# Patient Record
Sex: Male | Born: 1960 | Race: Black or African American | Hispanic: No | Marital: Married | Smoking: Never smoker
Health system: Southern US, Community
[De-identification: ages and names within clinical notes are randomized; demographics above are authoritative.]

## PROBLEM LIST (undated history)

## (undated) ENCOUNTER — Emergency Department (HOSPITAL_COMMUNITY): Admission: EM | Payer: Self-pay

## (undated) DIAGNOSIS — F32A Depression, unspecified: Secondary | ICD-10-CM

## (undated) DIAGNOSIS — F419 Anxiety disorder, unspecified: Secondary | ICD-10-CM

## (undated) DIAGNOSIS — J029 Acute pharyngitis, unspecified: Secondary | ICD-10-CM

## (undated) DIAGNOSIS — K5090 Crohn's disease, unspecified, without complications: Secondary | ICD-10-CM

## (undated) DIAGNOSIS — F329 Major depressive disorder, single episode, unspecified: Secondary | ICD-10-CM

## (undated) DIAGNOSIS — R21 Rash and other nonspecific skin eruption: Secondary | ICD-10-CM

## (undated) DIAGNOSIS — R519 Headache, unspecified: Secondary | ICD-10-CM

## (undated) DIAGNOSIS — R531 Weakness: Secondary | ICD-10-CM

## (undated) DIAGNOSIS — R51 Headache: Secondary | ICD-10-CM

## (undated) DIAGNOSIS — K603 Anal fistula: Secondary | ICD-10-CM

## (undated) DIAGNOSIS — R110 Nausea: Secondary | ICD-10-CM

## (undated) HISTORY — PX: OTHER SURGICAL HISTORY: SHX169

## (undated) HISTORY — DX: Rash and other nonspecific skin eruption: R21

## (undated) HISTORY — DX: Nausea: R11.0

## (undated) HISTORY — DX: Anal fistula: K60.3

## (undated) HISTORY — DX: Anxiety disorder, unspecified: F41.9

## (undated) HISTORY — DX: Major depressive disorder, single episode, unspecified: F32.9

## (undated) HISTORY — DX: Weakness: R53.1

## (undated) HISTORY — DX: Acute pharyngitis, unspecified: J02.9

## (undated) HISTORY — DX: Headache: R51

## (undated) HISTORY — DX: Headache, unspecified: R51.9

## (undated) HISTORY — DX: Depression, unspecified: F32.A

---

## 2011-10-14 ENCOUNTER — Emergency Department (HOSPITAL_COMMUNITY): Payer: Worker's Compensation

## 2011-10-14 ENCOUNTER — Inpatient Hospital Stay (HOSPITAL_COMMUNITY)
Admission: EM | Admit: 2011-10-14 | Discharge: 2011-10-20 | DRG: 964 | Disposition: A | Discharge status: Home / Self Care | Payer: Worker's Compensation | Attending: Surgery | Admitting: Surgery

## 2011-10-14 ENCOUNTER — Encounter (HOSPITAL_COMMUNITY): Payer: Self-pay | Admitting: Emergency Medicine

## 2011-10-14 DIAGNOSIS — S36113A Laceration of liver, unspecified degree, initial encounter: Secondary | ICD-10-CM

## 2011-10-14 DIAGNOSIS — Y9269 Other specified industrial and construction area as the place of occurrence of the external cause: Secondary | ICD-10-CM

## 2011-10-14 DIAGNOSIS — Z79899 Other long term (current) drug therapy: Secondary | ICD-10-CM

## 2011-10-14 DIAGNOSIS — K5090 Crohn's disease, unspecified, without complications: Secondary | ICD-10-CM

## 2011-10-14 DIAGNOSIS — I10 Essential (primary) hypertension: Secondary | ICD-10-CM | POA: Diagnosis present

## 2011-10-14 DIAGNOSIS — N280 Ischemia and infarction of kidney: Secondary | ICD-10-CM

## 2011-10-14 DIAGNOSIS — S36229A Contusion of unspecified part of pancreas, initial encounter: Secondary | ICD-10-CM

## 2011-10-14 DIAGNOSIS — S36209A Unspecified injury of unspecified part of pancreas, initial encounter: Secondary | ICD-10-CM

## 2011-10-14 DIAGNOSIS — S37009A Unspecified injury of unspecified kidney, initial encounter: Secondary | ICD-10-CM

## 2011-10-14 DIAGNOSIS — N2889 Other specified disorders of kidney and ureter: Secondary | ICD-10-CM | POA: Diagnosis present

## 2011-10-14 DIAGNOSIS — E876 Hypokalemia: Secondary | ICD-10-CM | POA: Diagnosis present

## 2011-10-14 DIAGNOSIS — IMO0001 Reserved for inherently not codable concepts without codable children: Principal | ICD-10-CM | POA: Diagnosis present

## 2011-10-14 DIAGNOSIS — S381XXA Crushing injury of abdomen, lower back, and pelvis, initial encounter: Secondary | ICD-10-CM

## 2011-10-14 DIAGNOSIS — W3189XA Contact with other specified machinery, initial encounter: Secondary | ICD-10-CM

## 2011-10-14 DIAGNOSIS — D62 Acute posthemorrhagic anemia: Secondary | ICD-10-CM | POA: Diagnosis not present

## 2011-10-14 DIAGNOSIS — Y990 Civilian activity done for income or pay: Secondary | ICD-10-CM

## 2011-10-14 HISTORY — DX: Crohn's disease, unspecified, without complications: K50.90

## 2011-10-14 LAB — CDS SEROLOGY

## 2011-10-14 LAB — COMPREHENSIVE METABOLIC PANEL
ALT: 401 U/L — ABNORMAL HIGH (ref 0–53)
AST: 495 U/L — ABNORMAL HIGH (ref 0–37)
Albumin: 3.8 g/dL (ref 3.5–5.2)
CO2: 27 mEq/L (ref 19–32)
Chloride: 102 mEq/L (ref 96–112)
Creatinine, Ser: 1.35 mg/dL (ref 0.50–1.35)
Potassium: 3.2 mEq/L — ABNORMAL LOW (ref 3.5–5.1)
Sodium: 142 mEq/L (ref 135–145)
Total Bilirubin: 1.8 mg/dL — ABNORMAL HIGH (ref 0.3–1.2)

## 2011-10-14 LAB — POCT I-STAT, CHEM 8
BUN: 10 mg/dL (ref 6–23)
Calcium, Ion: 1.15 mmol/L (ref 1.12–1.32)
Chloride: 103 mEq/L (ref 96–112)
Creatinine, Ser: 1.5 mg/dL — ABNORMAL HIGH (ref 0.50–1.35)
Glucose, Bld: 116 mg/dL — ABNORMAL HIGH (ref 70–99)

## 2011-10-14 LAB — CBC
Hemoglobin: 14.2 g/dL (ref 13.0–17.0)
MCH: 29.5 pg (ref 26.0–34.0)
MCHC: 35.5 g/dL (ref 30.0–36.0)
MCHC: 35.4 g/dL (ref 30.0–36.0)
MCV: 83.2 fL (ref 78.0–100.0)
Platelets: 156 10*3/uL (ref 150–400)
Platelets: 135 10*3/uL — ABNORMAL LOW (ref 150–400)
RBC: 4.81 MIL/uL (ref 4.22–5.81)
RDW: 13.5 % (ref 11.5–15.5)

## 2011-10-14 LAB — PROTIME-INR: Prothrombin Time: 13.3 seconds (ref 11.6–15.2)

## 2011-10-14 LAB — URINALYSIS, MICROSCOPIC ONLY
Bilirubin Urine: NEGATIVE
Glucose, UA: NEGATIVE mg/dL
Ketones, ur: NEGATIVE mg/dL
Protein, ur: 100 mg/dL — AB
pH: 7 (ref 5.0–8.0)

## 2011-10-14 LAB — LACTIC ACID, PLASMA: Lactic Acid, Venous: 2.2 mmol/L (ref 0.5–2.2)

## 2011-10-14 LAB — ABO/RH: ABO/RH(D): B POS

## 2011-10-14 LAB — MRSA PCR SCREENING: MRSA by PCR: NEGATIVE

## 2011-10-14 MED ORDER — LISINOPRIL 20 MG PO TABS
20.0000 mg | ORAL_TABLET | Freq: Every day | ORAL | Status: DC
  Administered 2011-10-14 – 2011-10-20 (×7): 20 mg via ORAL
  Filled 2011-10-14 (×7): qty 1

## 2011-10-14 MED ORDER — HYDROMORPHONE HCL PF 1 MG/ML IJ SOLN
1.0000 mg | Freq: Once | INTRAMUSCULAR | Status: AC
  Administered 2011-10-14: 1 mg via INTRAVENOUS
  Filled 2011-10-14: qty 1

## 2011-10-14 MED ORDER — PANTOPRAZOLE SODIUM 40 MG IV SOLR
40.0000 mg | Freq: Every day | INTRAVENOUS | Status: DC
  Filled 2011-10-14 (×3): qty 40

## 2011-10-14 MED ORDER — DIPHENHYDRAMINE HCL 12.5 MG/5ML PO ELIX
12.5000 mg | ORAL_SOLUTION | Freq: Four times a day (QID) | ORAL | Status: DC | PRN
  Filled 2011-10-14: qty 5

## 2011-10-14 MED ORDER — ONDANSETRON HCL 4 MG/2ML IJ SOLN
4.0000 mg | Freq: Four times a day (QID) | INTRAMUSCULAR | Status: DC | PRN
  Administered 2011-10-14: 4 mg via INTRAVENOUS
  Filled 2011-10-14: qty 2

## 2011-10-14 MED ORDER — SODIUM CHLORIDE 0.9 % IJ SOLN
9.0000 mL | INTRAMUSCULAR | Status: DC | PRN
  Administered 2011-10-17: 9 mL via INTRAVENOUS

## 2011-10-14 MED ORDER — IOHEXOL 300 MG/ML  SOLN
100.0000 mL | Freq: Once | INTRAMUSCULAR | Status: AC | PRN
  Administered 2011-10-14: 100 mL via INTRAVENOUS

## 2011-10-14 MED ORDER — HYDROMORPHONE HCL PF 1 MG/ML IJ SOLN
1.0000 mg | Freq: Once | INTRAMUSCULAR | Status: AC
  Administered 2011-10-14: 1 mg via INTRAVENOUS

## 2011-10-14 MED ORDER — NALOXONE HCL 0.4 MG/ML IJ SOLN: 0.4000 mg | INTRAMUSCULAR | Status: DC | PRN

## 2011-10-14 MED ORDER — PANTOPRAZOLE SODIUM 40 MG PO TBEC
40.0000 mg | DELAYED_RELEASE_TABLET | Freq: Every day | ORAL | Status: DC
  Administered 2011-10-14 – 2011-10-20 (×7): 40 mg via ORAL
  Filled 2011-10-14 (×7): qty 1

## 2011-10-14 MED ORDER — DEXTROSE IN LACTATED RINGERS 5 % IV SOLN
INTRAVENOUS | Status: DC
  Administered 2011-10-14 – 2011-10-15 (×2): 125 mL/h via INTRAVENOUS

## 2011-10-14 MED ORDER — HYDROMORPHONE 0.3 MG/ML IV SOLN
INTRAVENOUS | Status: DC
  Administered 2011-10-14: 20:00:00 via INTRAVENOUS
  Administered 2011-10-15 (×3): 0.3 mg via INTRAVENOUS
  Administered 2011-10-15: 0.6 mg via INTRAVENOUS
  Administered 2011-10-15 (×2): 0.3 mg via INTRAVENOUS
  Filled 2011-10-14: qty 25

## 2011-10-14 MED ORDER — HYDROMORPHONE 0.3 MG/ML IV SOLN
INTRAVENOUS | Status: AC
  Filled 2011-10-14: qty 25

## 2011-10-14 MED ORDER — DIPHENHYDRAMINE HCL 50 MG/ML IJ SOLN
12.5000 mg | Freq: Four times a day (QID) | INTRAMUSCULAR | Status: DC | PRN
  Administered 2011-10-15 (×2): 12.5 mg via INTRAVENOUS
  Filled 2011-10-14 (×2): qty 1

## 2011-10-14 MED ORDER — QUINAPRIL HCL 10 MG PO TABS: 20.0000 mg | ORAL_TABLET | Freq: Every day | ORAL | Status: DC

## 2011-10-14 NOTE — ED Notes (Signed)
2313-01 Ready

## 2011-10-14 NOTE — ED Notes (Signed)
Patient transported to CT with MD, RN and RN on monitor

## 2011-10-14 NOTE — ED Provider Notes (Signed)
Patient seen on arrival with trauma team as level 1 trauma. Patient was at work and leaned over a large roller  onto his abdomen and a large roller that was above him came down and had him trapped between the 2 rollers for a brief period of time. A coworker hit the kill switch. He almost passed out and had rectal incontenance and vomiting.  PT c/o abdominal pain.  PT presents via EMS without backboard or C spine precautions.  PCP [facility]  PMH Crohn's Disease HTN  Surgery--intestinal surgery.  NKA  SH Denies smoking or alcohol employed  Meds none  PE done by Trauma and Dr Regenia Skeeter. Pt is alert, appears painful. He has tenderness in his epigastric abdomen. He is intact neurologically in his lower extremities.   FAST neg by Dr Grandville Silos.  Pt had patent airway and turned over to trauma team.   I saw and evaluated the patient, reviewed the resident's note and I agree with the findings and plan. [name], MD, FACEP   Janice Norrie, MD 10/14/11 [phone]

## 2011-10-14 NOTE — ED Notes (Signed)
Per ems- pt was working and was leaning over a roller and fell back into it and was pinned between two rollers and another coworker shut the machine off.

## 2011-10-14 NOTE — ED Provider Notes (Signed)
History     CSN: [id number]  Arrival date & time 10/14/11  1704   First MD Initiated Contact with Patient 10/14/11 1717      Chief Complaint  Patient presents with  . Trauma    (Consider location/radiation/quality/duration/timing/severity/associated sxs/prior treatment) Patient is a 51 y.o. male presenting with trauma. The history is provided by the patient and the EMS personnel.  Trauma This is a new problem. The current episode started today. The problem occurs constantly. The problem has been unchanged. Associated symptoms include abdominal pain and vomiting (once). Pertinent negatives include no chest pain, chills, congestion, coughing, fever, headaches, nausea or numbness. The symptoms are aggravated by nothing. He has tried nothing for the symptoms.    Past Medical History  Diagnosis Date  . Crohn disease     Past Surgical History  Procedure Date  . Bowl resection     No family history on file.  History  Substance Use Topics  . Smoking status: Not on file  . Smokeless tobacco: Not on file  . Alcohol Use:       Review of Systems  Constitutional: Negative for fever and chills.  HENT: Negative for congestion and rhinorrhea.   Respiratory: Negative for cough and shortness of breath.   Cardiovascular: Negative for chest pain and leg swelling.  Gastrointestinal: Positive for vomiting (once) and abdominal pain. Negative for nausea, constipation and blood in stool.  Genitourinary: Negative for dysuria and decreased urine volume.  Musculoskeletal: Positive for back pain.  Neurological: Negative for numbness and headaches.  Psychiatric/Behavioral: Negative for confusion.  All other systems reviewed and are negative.    Allergies  Review of patient's allergies indicates no known allergies.  Home Medications  No current outpatient prescriptions on file.  BP 150/90  Pulse 73  Temp(Src) 97.7 F (36.5 C) (Oral)  Resp 26  SpO2 100%  Physical Exam  Nursing  note and vitals reviewed. Constitutional: He is oriented to person, place, and time. He appears well-developed and well-nourished.  HENT:  Head: Normocephalic and atraumatic.  Right Ear: External ear normal.  Left Ear: External ear normal.  Nose: Nose normal.  Eyes: EOM are normal. Pupils are equal, round, and reactive to light.  Neck: Neck supple.  Cardiovascular: Normal rate, regular rhythm, normal heart sounds and intact distal pulses.   Pulmonary/Chest: Effort normal and breath sounds normal. No respiratory distress. He has no wheezes. He has no rales. He exhibits no tenderness.  Abdominal: Soft. He exhibits no distension and no mass. There is tenderness in the epigastric area and left upper quadrant. There is no rigidity, no rebound and no guarding.  Genitourinary: Rectum normal.  Musculoskeletal: He exhibits no edema.       Lumbar back: He exhibits tenderness.  Lymphadenopathy:    He has no cervical adenopathy.  Neurological: He is alert and oriented to person, place, and time.  Skin: Skin is warm and dry.    ED Course  Procedures (including critical care time)  Labs Reviewed  COMPREHENSIVE METABOLIC PANEL - Abnormal; Notable for the following:    Potassium 3.2 (*)    Glucose, Bld 112 (*)    AST 495 (*)    ALT 401 (*)    Total Bilirubin 1.8 (*)    GFR calc non Af Amer 60 (*)    GFR calc Af Amer 70 (*)    All other components within normal limits  URINALYSIS, WITH MICROSCOPIC - Abnormal; Notable for the following:    Hgb urine dipstick  SMALL (*)    Protein, ur 100 (*)    Casts HYALINE CASTS (*)    All other components within normal limits  LIPASE, BLOOD - Abnormal; Notable for the following:    Lipase 60 (*)    All other components within normal limits  POCT I-STAT, CHEM 8 - Abnormal; Notable for the following:    Potassium 3.2 (*)    Creatinine, Ser 1.50 (*)    Glucose, Bld 116 (*)    All other components within normal limits  TYPE AND SCREEN  CDS SEROLOGY  CBC    LACTIC ACID, PLASMA  PROTIME-INR  ABO/RH  SAMPLE TO BLOOD BANK  LACTIC ACID, PLASMA   Ct Chest W Contrast  10/14/2011  *RADIOLOGY REPORT*  Clinical Data:  51 year old male with a crush injury and chest, abdomen, and pelvic pain.  CT CHEST, ABDOMEN AND PELVIS WITH CONTRAST  Technique:  Multidetector CT imaging of the chest, abdomen and pelvis was performed following the standard protocol during bolus administration of intravenous contrast.  Contrast: 128mL OMNIPAQUE IOHEXOL 300 MG/ML IJ SOLN  Comparison:  None  CT CHEST  Findings:  The heart and great vessels are within normal limits except for moderate coronary artery calcifications. There is no evidence of mediastinal hematoma, pneumothorax or pleural / pericardial effusions. No enlarged abnormal appearing lymph nodes are identified.  The lungs are clear except for minimal right basilar atelectasis. There is no evidence of airspace disease, consolidation, nodule/mass or endobronchial/endotracheal lesions. No acute or suspicious bony abnormalities are identified.  IMPRESSION: No evidence of acute injury to the chest.  Minimal right basilar atelectasis.  Coronary artery disease.  CT ABDOMEN AND PELVIS  Findings:  A 2 cm laceration of the inferior medial left hepatic segment, adjacent to the falciform ligament, is noted with adjacent hematoma and small amount of complex fluid/blood within the abdomen and paracolic gutters.  There is blood between the pancreatic body and splenic vein compatible with pancreatic injury.  No large fracture lines are identified but portions of the body are indistinct. There are several small infarcts within the left kidney.  The left renal artery is grossly unremarkable. Hemorrhage within the left retroperitoneum is identified. Hemorrhage/hematoma in the region of the gastrohepatic ligament and inferior to the left liver is noted.  There is no evidence of acute splenic abnormality. The adrenal glands, right kidney and gallbladder  are unremarkable.  There is no evidence of pneumoperitoneum. No bowel abnormalities identified except for changes of previous right colonic/small bowel surgery. There is no evidence of biliary dilatation or abdominal aortic aneurysm.  No acute or suspicious bony abnormalities are present.  IMPRESSION: 2 cm inferior left hepatic laceration with adjacent hemorrhage/hematoma and small amount of blood within the abdomen.  Pancreatic body injury without discrete fracture. Some portions of the pancreatic body however are indistinct.  Multiple small left renal infarcts.  The left renal artery is grossly unremarkable but consider further evaluation as clinically indicated.  Original Report Authenticated By: Lura Em, M.D.   Ct Abdomen Pelvis W Contrast  10/14/2011  *RADIOLOGY REPORT*  Clinical Data:  51 year old male with a crush injury and chest, abdomen, and pelvic pain.  CT CHEST, ABDOMEN AND PELVIS WITH CONTRAST  Technique:  Multidetector CT imaging of the chest, abdomen and pelvis was performed following the standard protocol during bolus administration of intravenous contrast.  Contrast: 17mL OMNIPAQUE IOHEXOL 300 MG/ML IJ SOLN  Comparison:  None  CT CHEST  Findings:  The heart and great vessels are  within normal limits except for moderate coronary artery calcifications. There is no evidence of mediastinal hematoma, pneumothorax or pleural / pericardial effusions. No enlarged abnormal appearing lymph nodes are identified.  The lungs are clear except for minimal right basilar atelectasis. There is no evidence of airspace disease, consolidation, nodule/mass or endobronchial/endotracheal lesions. No acute or suspicious bony abnormalities are identified.  IMPRESSION: No evidence of acute injury to the chest.  Minimal right basilar atelectasis.  Coronary artery disease.  CT ABDOMEN AND PELVIS  Findings:  A 2 cm laceration of the inferior medial left hepatic segment, adjacent to the falciform ligament, is noted  with adjacent hematoma and small amount of complex fluid/blood within the abdomen and paracolic gutters.  There is blood between the pancreatic body and splenic vein compatible with pancreatic injury.  No large fracture lines are identified but portions of the body are indistinct. There are several small infarcts within the left kidney.  The left renal artery is grossly unremarkable. Hemorrhage within the left retroperitoneum is identified. Hemorrhage/hematoma in the region of the gastrohepatic ligament and inferior to the left liver is noted.  There is no evidence of acute splenic abnormality. The adrenal glands, right kidney and gallbladder are unremarkable.  There is no evidence of pneumoperitoneum. No bowel abnormalities identified except for changes of previous right colonic/small bowel surgery. There is no evidence of biliary dilatation or abdominal aortic aneurysm.  No acute or suspicious bony abnormalities are present.  IMPRESSION: 2 cm inferior left hepatic laceration with adjacent hemorrhage/hematoma and small amount of blood within the abdomen.  Pancreatic body injury without discrete fracture. Some portions of the pancreatic body however are indistinct.  Multiple small left renal infarcts.  The left renal artery is grossly unremarkable but consider further evaluation as clinically indicated.  Original Report Authenticated By: Lura Em, M.D.   Dg Pelvis Portable  10/14/2011  *RADIOLOGY REPORT*  Clinical Data: [place] between two steel rollers, crushing injury.  PORTABLE PELVIS  Comparison:  None.  Findings:  There is no evidence of pelvic fracture or diastasis. No other pelvic bone lesions are seen. Triangular radiopaque density at the intersection of the bladder and the right superior pubic bone felt to represent a phlebolith.  IMPRESSION: Negative.  Original Report Authenticated By: Staci Righter, M.D.   Dg Chest Port 1 View  10/14/2011  *RADIOLOGY REPORT*  Clinical Data: Crushing injury.   PORTABLE CHEST - 1 VIEW  Comparison: None.  Findings: Normal heart size with clear lung fields.  No bony abnormality.  No visible free air.  IMPRESSION: Negative.  Recommend CT chest, abdomen, and pelvis for further evaluation.  Original Report Authenticated By: Staci Righter, M.D.     1. Crushing injury of abdomen   2. Liver laceration       MDM  51 yo male who was crushed between 2 rollers at work for a few seconds until someone hit the "kill switch". One roller landed on top of him on his back as he was on the other. Level 1 trauma alert for mechanism and physician discretion. Abd exam stable, and FAST exam negative. EJ placed by myself for IV access. Scans show small liver lac as well as elevated transaminases. Pain pretty well controlled in ED. VS remained stable in ED. Admitted to trauma.      Sherwood Gambler, MD 10/14/11 [phone]

## 2011-10-14 NOTE — H&P (Signed)
Darren Burnett is an 51 y.o. male.   Chief Complaint: Lower chest and upper abdominal pain after industrial accident HPI: Patient works at a [employer]. He was leaning over a paper roller when another roll was closed on top of him. This pinned him on his lower chest and upper abdomen for a few seconds until the machine could be opened up. He had a bowel movement during that time he was pinned and vomited directly after. He was brought in as a level one trauma. He complains of lower chest pain and upper abdominal pain. He had no loss of consciousness. He had no trauma to other portions of his body.  Past Medical History  Diagnosis Date  . Crohn disease     Past Surgical History  Procedure Date  . Bowl resection     No family history on file. Social History:  does not have a smoking history on file. He does not have any smokeless tobacco history on file. His alcohol and drug histories not on file.  Allergies: No Known Allergies  Medications Prior to Admission  Medication Dose Route Frequency Provider Last Rate Last Dose  . HYDROmorphone (DILAUDID) injection 1 mg  1 mg Intravenous Once Sherwood Gambler, MD      . HYDROmorphone (DILAUDID) injection 1 mg  1 mg Intravenous Once Sherwood Gambler, MD   1 mg at 10/14/11 1720  . iohexol (OMNIPAQUE) 300 MG/ML solution 100 mL  100 mL Intravenous Once PRN Medication Radiologist, MD   100 mL at 10/14/11 1743   Medications Prior to Admission  Medication Sig Dispense Refill  . amLODipine (NORVASC) 2.5 MG tablet Take 2.5 mg by mouth daily.      . Lansoprazole (PREVACID 24HR PO) Take 1 capsule by mouth daily as needed. For acid reflux      . quinapril (ACCUPRIL) 20 MG tablet Take 20 mg by mouth daily.        Results for orders placed during the hospital encounter of 10/14/11 (from the past 48 hour(s))  TYPE AND SCREEN     Status: Normal   Collection Time   10/14/11  4:55 PM      Component Value Range Comment   ABO/RH(D) B POS      Antibody  Screen PENDING      Sample Expiration 10/17/2011      Unit Number [id number]      Blood Component Type RBC LR PHER1      Unit division 00      Status of Unit REL FROM [facility]      Unit tag comment VERBAL ORDERS PER DR KNAPP      Transfusion Status OK TO TRANSFUSE      Crossmatch Result PENDING      Unit Number [id number]      Blood Component Type RED CELLS,LR      Unit division 00      Status of Unit REL FROM [facility]      Unit tag comment VERBAL ORDERS PER DR KNAPP      Transfusion Status OK TO TRANSFUSE      Crossmatch Result PENDING     LACTIC ACID, PLASMA     Status: Normal   Collection Time   10/14/11  5:17 PM      Component Value Range Comment   Lactic Acid, Venous 2.2  0.5 - 2.2 (mmol/L)   CDS SEROLOGY     Status: Normal   Collection Time   10/14/11  5:21 PM  Component Value Range Comment   CDS serology specimen        Value: SPECIMEN WILL BE HELD FOR 14 DAYS IF TESTING IS REQUIRED  CBC     Status: Normal   Collection Time   10/14/11  5:21 PM      Component Value Range Comment   WBC 8.3  4.0 - 10.5 (K/uL)    RBC 4.81  4.22 - 5.81 (MIL/uL)    Hemoglobin 14.2  13.0 - 17.0 (g/dL)    HCT 40.0  39.0 - 52.0 (%)    MCV 83.2  78.0 - 100.0 (fL)    MCH 29.5  26.0 - 34.0 (pg)    MCHC 35.5  30.0 - 36.0 (g/dL)    RDW 13.5  11.5 - 15.5 (%)    Platelets 156  150 - 400 (K/uL)   PROTIME-INR     Status: Normal   Collection Time   10/14/11  5:21 PM      Component Value Range Comment   Prothrombin Time 13.3  11.6 - 15.2 (seconds)    INR 0.99  0.00 - 1.49    POCT I-STAT, CHEM 8     Status: Abnormal   Collection Time   10/14/11  5:29 PM      Component Value Range Comment   Sodium 144  135 - 145 (mEq/L)    Potassium 3.2 (*) 3.5 - 5.1 (mEq/L)    Chloride 103  96 - 112 (mEq/L)    BUN 10  6 - 23 (mg/dL)    Creatinine, Ser 1.50 (*) 0.50 - 1.35 (mg/dL)    Glucose, Bld 116 (*) 70 - 99 (mg/dL)    Calcium, Ion 1.15  1.12 - 1.32 (mmol/L)    TCO2 29  0 - 100 (mmol/L)    Hemoglobin 14.3  13.0  - 17.0 (g/dL)    HCT 42.0  39.0 - 52.0 (%)    Dg Pelvis Portable  10/14/2011  *RADIOLOGY REPORT*  Clinical Data: [place] between two steel rollers, crushing injury.  PORTABLE PELVIS  Comparison:  None.  Findings:  There is no evidence of pelvic fracture or diastasis. No other pelvic bone lesions are seen. Triangular radiopaque density at the intersection of the bladder and the right superior pubic bone felt to represent a phlebolith.  IMPRESSION: Negative.  Original Report Authenticated By: Staci Righter, M.D.   Dg Chest Port 1 View  10/14/2011  *RADIOLOGY REPORT*  Clinical Data: Crushing injury.  PORTABLE CHEST - 1 VIEW  Comparison: None.  Findings: Normal heart size with clear lung fields.  No bony abnormality.  No visible free air.  IMPRESSION: Negative.  Recommend CT chest, abdomen, and pelvis for further evaluation.  Original Report Authenticated By: Staci Righter, M.D.    Review of Systems  Constitutional: Negative.   HENT: Negative.   Eyes: Negative.   Respiratory: Negative.   Cardiovascular: Positive for chest pain.       Lower rib pain  Gastrointestinal: Positive for vomiting and abdominal pain. Negative for blood in stool.       Emesis and bowel movement at the scene of the accident, history of Crohn's disease  Genitourinary: Negative.   Musculoskeletal: Positive for back pain.  Skin: Negative.   Neurological: Negative.   Endo/Heme/Allergies: Negative.     Blood pressure 150/90, pulse 73, temperature 97.7 F (36.5 C), temperature source Oral, resp. rate 26, SpO2 100.00%. Physical Exam  Constitutional: He is oriented to person, place, and time. He appears well-developed and  well-nourished. He appears distressed.  HENT:  Head: Normocephalic and atraumatic.  Mouth/Throat: Oropharynx is clear and moist.  Eyes: Conjunctivae and EOM are normal. Pupils are equal, round, and reactive to light.  Neck: Normal range of motion. Neck supple.       No tenderness  Cardiovascular: Normal  rate, regular rhythm, normal heart sounds and intact distal pulses.   Respiratory: Effort normal and breath sounds normal. No respiratory distress. He has no wheezes. He has no rales. He exhibits tenderness.       Lower rib tenderness anteriorly  GI: Soft. He exhibits no distension. There is tenderness. There is no rebound and no guarding.       Upper abdominal and epigastric tenderness with no guarding, rectal exam Brown stool with no gross blood  Genitourinary: Penis normal.  Musculoskeletal: Normal range of motion.  Neurological: He is alert and oriented to person, place, and time. Coordination normal.       Good strength x4 extremities  Skin: Skin is warm and dry.     Assessment/Plan Crushing injury to lower chest and upper abdomen Grade 2 liver laceration - bedrest and serial Hb Pancreatic contusion - check amylase and lipase, NPO Small L renal infarcts - F/U creatinine HTN - start one home med for now Admit to ICU  Oday Ridings E 10/14/2011, 6:04 PM

## 2011-10-14 NOTE — ED Notes (Signed)
MD states to admit to ICU. Lacerated liver and pancrease damage

## 2011-10-14 NOTE — Progress Notes (Addendum)
Chaplain's Note:  Responded to pg for LV1 crush @ 17:00.  Offered pastoral presence to staff and patient while pt being treated.  Following initial triage and treatment, introduced myself to pt and offered pastoral presence and support.  Patient stated wife was coming from [place].  Pt left for cat scan.  Will follow up as able or requested.  18:00 follow-up with pt after scans in rm 1.  Offered support to pt.  Patient had a person from his employer enter to check on him.  Excused myself so they could talk.  Patient thanked chaplain for visit. Will follow-up as needed or requested

## 2011-10-15 LAB — COMPREHENSIVE METABOLIC PANEL
ALT: 249 U/L — ABNORMAL HIGH (ref 0–53)
AST: 266 U/L — ABNORMAL HIGH (ref 0–37)
CO2: 29 mEq/L (ref 19–32)
Calcium: 8.6 mg/dL (ref 8.4–10.5)
GFR calc non Af Amer: 66 mL/min — ABNORMAL LOW (ref >90)
Potassium: 3.2 mEq/L — ABNORMAL LOW (ref 3.5–5.1)
Sodium: 144 mEq/L (ref 135–145)

## 2011-10-15 LAB — CBC
HCT: 35.3 % — ABNORMAL LOW (ref 39.0–52.0)
Hemoglobin: 12.3 g/dL — ABNORMAL LOW (ref 13.0–17.0)
Hemoglobin: 12.9 g/dL — ABNORMAL LOW (ref 13.0–17.0)
MCH: 29.6 pg (ref 26.0–34.0)
MCHC: 34.8 g/dL (ref 30.0–36.0)
MCHC: 34.5 g/dL (ref 30.0–36.0)
Platelets: 147 10*3/uL — ABNORMAL LOW (ref 150–400)
RBC: 4.47 MIL/uL (ref 4.22–5.81)
RBC: 4.38 MIL/uL (ref 4.22–5.81)
WBC: 8.1 10*3/uL (ref 4.0–10.5)
WBC: 6.3 10*3/uL (ref 4.0–10.5)

## 2011-10-15 LAB — TYPE AND SCREEN: Unit division: 0

## 2011-10-15 LAB — AMYLASE: Amylase: 54 U/L (ref 0–105)

## 2011-10-15 MED ORDER — POTASSIUM CHLORIDE CRYS ER 20 MEQ PO TBCR
20.0000 meq | EXTENDED_RELEASE_TABLET | Freq: Three times a day (TID) | ORAL | Status: AC
  Administered 2011-10-15 (×3): 20 meq via ORAL
  Filled 2011-10-15 (×3): qty 1

## 2011-10-15 MED ORDER — CHLORHEXIDINE GLUCONATE 0.12 % MT SOLN
OROMUCOSAL | Status: AC
  Filled 2011-10-15: qty 15

## 2011-10-15 MED ORDER — KCL-LACTATED RINGERS-D5W 20 MEQ/L IV SOLN
INTRAVENOUS | Status: DC
  Administered 2011-10-15 – 2011-10-16 (×3): via INTRAVENOUS
  Administered 2011-10-17: 75 mL/h via INTRAVENOUS
  Filled 2011-10-15 (×7): qty 1000

## 2011-10-15 NOTE — Progress Notes (Signed)
Will decrease frequency of Hgb checks.  This patient has been seen and I agree with the findings and treatment plan.  Kathryne Eriksson. Dahlia Bailiff, MD, [facility] [phone] (pager) [phone] (direct pager) Trauma Surgeon

## 2011-10-15 NOTE — Progress Notes (Signed)
Subjective: Minimal c/o abd and back pain. Passing flatus.   Objective: Vital signs in last 24 hours: Temp:  [97.4 F (36.3 C)-98.1 F (36.7 C)] 97.5 F (36.4 C) (03/29 0753) Pulse Rate:  [45-74] 45  (03/29 0700) Resp:  [9-26] 10  (03/29 0700) BP: (100-165)/(63-98) 126/85 mmHg (03/29 0700) SpO2:  [97 %-100 %] 98 % (03/29 0700)    Intake/Output from previous day: 03/28 0701 - 03/29 0700 In: 1504 [I.V.:1504] Out: 1400 [Urine:1400] Intake/Output this shift:    General appearance: alert, cooperative, appears stated age and no distress Resp: clear to auscultation bilaterally Cardio: regular rate and rhythm and bradycardic at times GI: soft, mildly-tender; bowel sounds normal; no masses,  no organomegaly Neurologic: Grossly normal  Lab Results:   Basename 10/15/11 0650 10/15/11 0042  WBC 6.0 8.1  HGB 12.3* 13.1  HCT 35.3* 37.9*  PLT 130* 147*   BMET  Basename 10/15/11 0650 10/14/11 1729 10/14/11 1721  NA 144 144 --  K 3.2* 3.2* --  CL 108 103 --  CO2 29 -- 27  GLUCOSE 123* 116* --  BUN 9 10 --  CREATININE 1.25 1.50* --  CALCIUM 8.6 -- 9.5   PT/INR  Basename 10/14/11 1721  LABPROT 13.3  INR 0.99   ABG No results found for this basename: PHART:2,PCO2:2,PO2:2,HCO3:2 in the last 72 hours  Studies/Results: Ct Chest W Contrast  10/14/2011  *RADIOLOGY REPORT*  Clinical Data:  51 year old male with a crush injury and chest, abdomen, and pelvic pain.  CT CHEST, ABDOMEN AND PELVIS WITH CONTRAST  Technique:  Multidetector CT imaging of the chest, abdomen and pelvis was performed following the standard protocol during bolus administration of intravenous contrast.  Contrast: 158mL OMNIPAQUE IOHEXOL 300 MG/ML IJ SOLN  Comparison:  None  CT CHEST  Findings:  The heart and great vessels are within normal limits except for moderate coronary artery calcifications. There is no evidence of mediastinal hematoma, pneumothorax or pleural / pericardial effusions. No enlarged abnormal  appearing lymph nodes are identified.  The lungs are clear except for minimal right basilar atelectasis. There is no evidence of airspace disease, consolidation, nodule/mass or endobronchial/endotracheal lesions. No acute or suspicious bony abnormalities are identified.  IMPRESSION: No evidence of acute injury to the chest.  Minimal right basilar atelectasis.  Coronary artery disease.  CT ABDOMEN AND PELVIS  Findings:  A 2 cm laceration of the inferior medial left hepatic segment, adjacent to the falciform ligament, is noted with adjacent hematoma and small amount of complex fluid/blood within the abdomen and paracolic gutters.  There is blood between the pancreatic body and splenic vein compatible with pancreatic injury.  No large fracture lines are identified but portions of the body are indistinct. There are several small infarcts within the left kidney.  The left renal artery is grossly unremarkable. Hemorrhage within the left retroperitoneum is identified. Hemorrhage/hematoma in the region of the gastrohepatic ligament and inferior to the left liver is noted.  There is no evidence of acute splenic abnormality. The adrenal glands, right kidney and gallbladder are unremarkable.  There is no evidence of pneumoperitoneum. No bowel abnormalities identified except for changes of previous right colonic/small bowel surgery. There is no evidence of biliary dilatation or abdominal aortic aneurysm.  No acute or suspicious bony abnormalities are present.  IMPRESSION: 2 cm inferior left hepatic laceration with adjacent hemorrhage/hematoma and small amount of blood within the abdomen.  Pancreatic body injury without discrete fracture. Some portions of the pancreatic body however are indistinct.  Multiple  small left renal infarcts.  The left renal artery is grossly unremarkable but consider further evaluation as clinically indicated.  Original Report Authenticated By: Lura Em, M.D.   Ct Abdomen Pelvis W  Contrast  10/14/2011  *RADIOLOGY REPORT*  Clinical Data:  52 year old male with a crush injury and chest, abdomen, and pelvic pain.  CT CHEST, ABDOMEN AND PELVIS WITH CONTRAST  Technique:  Multidetector CT imaging of the chest, abdomen and pelvis was performed following the standard protocol during bolus administration of intravenous contrast.  Contrast: 138mL OMNIPAQUE IOHEXOL 300 MG/ML IJ SOLN  Comparison:  None  CT CHEST  Findings:  The heart and great vessels are within normal limits except for moderate coronary artery calcifications. There is no evidence of mediastinal hematoma, pneumothorax or pleural / pericardial effusions. No enlarged abnormal appearing lymph nodes are identified.  The lungs are clear except for minimal right basilar atelectasis. There is no evidence of airspace disease, consolidation, nodule/mass or endobronchial/endotracheal lesions. No acute or suspicious bony abnormalities are identified.  IMPRESSION: No evidence of acute injury to the chest.  Minimal right basilar atelectasis.  Coronary artery disease.  CT ABDOMEN AND PELVIS  Findings:  A 2 cm laceration of the inferior medial left hepatic segment, adjacent to the falciform ligament, is noted with adjacent hematoma and small amount of complex fluid/blood within the abdomen and paracolic gutters.  There is blood between the pancreatic body and splenic vein compatible with pancreatic injury.  No large fracture lines are identified but portions of the body are indistinct. There are several small infarcts within the left kidney.  The left renal artery is grossly unremarkable. Hemorrhage within the left retroperitoneum is identified. Hemorrhage/hematoma in the region of the gastrohepatic ligament and inferior to the left liver is noted.  There is no evidence of acute splenic abnormality. The adrenal glands, right kidney and gallbladder are unremarkable.  There is no evidence of pneumoperitoneum. No bowel abnormalities identified except for  changes of previous right colonic/small bowel surgery. There is no evidence of biliary dilatation or abdominal aortic aneurysm.  No acute or suspicious bony abnormalities are present.  IMPRESSION: 2 cm inferior left hepatic laceration with adjacent hemorrhage/hematoma and small amount of blood within the abdomen.  Pancreatic body injury without discrete fracture. Some portions of the pancreatic body however are indistinct.  Multiple small left renal infarcts.  The left renal artery is grossly unremarkable but consider further evaluation as clinically indicated.  Original Report Authenticated By: Lura Em, M.D.   Dg Pelvis Portable  10/14/2011  *RADIOLOGY REPORT*  Clinical Data: [place] between two steel rollers, crushing injury.  PORTABLE PELVIS  Comparison:  None.  Findings:  There is no evidence of pelvic fracture or diastasis. No other pelvic bone lesions are seen. Triangular radiopaque density at the intersection of the bladder and the right superior pubic bone felt to represent a phlebolith.  IMPRESSION: Negative.  Original Report Authenticated By: Staci Righter, M.D.   Dg Chest Port 1 View  10/14/2011  *RADIOLOGY REPORT*  Clinical Data: Crushing injury.  PORTABLE CHEST - 1 VIEW  Comparison: None.  Findings: Normal heart size with clear lung fields.  No bony abnormality.  No visible free air.  IMPRESSION: Negative.  Recommend CT chest, abdomen, and pelvis for further evaluation.  Original Report Authenticated By: Staci Righter, M.D.    Anti-infectives: Anti-infectives    None      Assessment/Plan: s/p * No surgery found * Patient Active Problem List  Diagnoses  .  Pancreatic contusion  . Liver laceration  . Renal infarct  . Crohn disease    Crush to abd Pancreatic contusion- Lipase normalized, will start some clears and see how he does with po Liver laceration- HD stable, slight decline in Hgb this am, will repeat later today and am Renal infarcts- Renal function stable, normal,  will continue to monitor Mild ABL anemia-follow up later today Crohn's Dz- not currently on meds Hx HTN- follow FEN- Hypokalemia- Change MIVF and decrease rate, start clears po and monitor tolerance VTE- SCD's DISPO- as above, allow some clear liquids, re check H&H later today. Continue bedrest today.    LOS: 1 day    [facility] Pager [phone] General Trauma Pager [phone]

## 2011-10-15 NOTE — ED Provider Notes (Signed)
See prior note   Janice Norrie, MD 10/15/11 0020

## 2011-10-15 NOTE — Progress Notes (Signed)
Utilization review completed. Jacquelina Hewins Diane3/29/2013

## 2011-10-16 ENCOUNTER — Encounter (HOSPITAL_COMMUNITY): Payer: Self-pay | Admitting: *Deleted

## 2011-10-16 LAB — CBC
MCV: 85.9 fL (ref 78.0–100.0)
Platelets: 126 10*3/uL — ABNORMAL LOW (ref 150–400)
RBC: 4.27 MIL/uL (ref 4.22–5.81)
WBC: 6.2 10*3/uL (ref 4.0–10.5)

## 2011-10-16 LAB — COMPREHENSIVE METABOLIC PANEL
ALT: 251 U/L — ABNORMAL HIGH (ref 0–53)
AST: 197 U/L — ABNORMAL HIGH (ref 0–37)
CO2: 31 mEq/L (ref 19–32)
Chloride: 105 mEq/L (ref 96–112)
Creatinine, Ser: 1.37 mg/dL — ABNORMAL HIGH (ref 0.50–1.35)
GFR calc non Af Amer: 59 mL/min — ABNORMAL LOW (ref >90)
Total Bilirubin: 2.3 mg/dL — ABNORMAL HIGH (ref 0.3–1.2)

## 2011-10-16 LAB — AMYLASE: Amylase: 55 U/L (ref 0–105)

## 2011-10-16 MED ORDER — HYDROMORPHONE HCL PF 1 MG/ML IJ SOLN
1.0000 mg | INTRAMUSCULAR | Status: DC | PRN
  Administered 2011-10-17: 0.5 mg via INTRAVENOUS
  Filled 2011-10-16: qty 1

## 2011-10-16 MED ORDER — HYDROCODONE-ACETAMINOPHEN 5-325 MG PO TABS
1.0000 | ORAL_TABLET | ORAL | Status: DC | PRN
  Administered 2011-10-16 – 2011-10-17 (×5): 1 via ORAL
  Administered 2011-10-18: 2 via ORAL
  Administered 2011-10-18: 1 via ORAL
  Administered 2011-10-19: 2 via ORAL
  Filled 2011-10-16: qty 1
  Filled 2011-10-16 (×2): qty 2
  Filled 2011-10-16: qty 1
  Filled 2011-10-16 (×2): qty 2
  Filled 2011-10-16 (×3): qty 1
  Filled 2011-10-16: qty 2

## 2011-10-16 MED ORDER — SODIUM CHLORIDE 0.9 % IJ SOLN
10.0000 mL | Freq: Two times a day (BID) | INTRAMUSCULAR | Status: DC
  Administered 2011-10-16 – 2011-10-17 (×2): 10 mL
  Administered 2011-10-18: 13 mL
  Administered 2011-10-18 – 2011-10-19 (×2): 10 mL
  Administered 2011-10-19: 3 mL
  Administered 2011-10-20: 10 mL

## 2011-10-16 MED ORDER — SODIUM CHLORIDE 0.9 % IJ SOLN: 10.0000 mL | INTRAMUSCULAR | Status: DC | PRN

## 2011-10-16 NOTE — Progress Notes (Signed)
Patient ID: Darren Burnett, male   DOB: 08-29-1960, 51 y.o.   MRN: [id number]    Subjective: Minimal c/o abd and back pain. Passing flatus and tolerated clear liquids well yesterday.   Objective: Vital signs in last 24 hours: Temp:  [97.5 F (36.4 C)-98.3 F (36.8 C)] 98 F (36.7 C) (03/30 0000) Pulse Rate:  [46-61] 50  (03/30 0700) Resp:  [7-22] 20  (03/30 0700) BP: (105-159)/(64-97) 113/73 mmHg (03/30 0700) SpO2:  [96 %-100 %] 98 % (03/30 0700) Last BM Date: 10/15/11  Intake/Output from previous day: 03/29 0701 - 03/30 0700 In: 1869 [P.O.:840; I.V.:1029] Out: 700 [Urine:700] Intake/Output this shift:    General appearance: alert, cooperative, appears stated age and no distress Resp: clear to auscultation bilaterally Cardio: regular rate and rhythm and bradycardic at times GI: soft, mildly-tender; bowel sounds normal; no masses,  no organomegaly Neurologic: Grossly normal  Lab Results:   Basename 10/16/11 0345 10/15/11 1407  WBC 6.2 6.3  HGB 12.7* 12.9*  HCT 36.7* 37.4*  PLT 126* 129*   BMET  Basename 10/16/11 0345 10/15/11 0650  NA 141 144  K 3.9 3.2*  CL 105 108  CO2 31 29  GLUCOSE 102* 123*  BUN 8 9  CREATININE 1.37* 1.25  CALCIUM 8.7 8.6   PT/INR  Basename 10/14/11 1721  LABPROT 13.3  INR 0.99   ABG No results found for this basename: PHART:2,PCO2:2,PO2:2,HCO3:2 in the last 72 hours  Studies/Results: Ct Chest W Contrast  10/14/2011  *RADIOLOGY REPORT*  Clinical Data:  51 year old male with a crush injury and chest, abdomen, and pelvic pain.  CT CHEST, ABDOMEN AND PELVIS WITH CONTRAST  Technique:  Multidetector CT imaging of the chest, abdomen and pelvis was performed following the standard protocol during bolus administration of intravenous contrast.  Contrast: 173mL OMNIPAQUE IOHEXOL 300 MG/ML IJ SOLN  Comparison:  None  CT CHEST  Findings:  The heart and great vessels are within normal limits except for moderate coronary artery calcifications. There  is no evidence of mediastinal hematoma, pneumothorax or pleural / pericardial effusions. No enlarged abnormal appearing lymph nodes are identified.  The lungs are clear except for minimal right basilar atelectasis. There is no evidence of airspace disease, consolidation, nodule/mass or endobronchial/endotracheal lesions. No acute or suspicious bony abnormalities are identified.  IMPRESSION: No evidence of acute injury to the chest.  Minimal right basilar atelectasis.  Coronary artery disease.  CT ABDOMEN AND PELVIS  Findings:  A 2 cm laceration of the inferior medial left hepatic segment, adjacent to the falciform ligament, is noted with adjacent hematoma and small amount of complex fluid/blood within the abdomen and paracolic gutters.  There is blood between the pancreatic body and splenic vein compatible with pancreatic injury.  No large fracture lines are identified but portions of the body are indistinct. There are several small infarcts within the left kidney.  The left renal artery is grossly unremarkable. Hemorrhage within the left retroperitoneum is identified. Hemorrhage/hematoma in the region of the gastrohepatic ligament and inferior to the left liver is noted.  There is no evidence of acute splenic abnormality. The adrenal glands, right kidney and gallbladder are unremarkable.  There is no evidence of pneumoperitoneum. No bowel abnormalities identified except for changes of previous right colonic/small bowel surgery. There is no evidence of biliary dilatation or abdominal aortic aneurysm.  No acute or suspicious bony abnormalities are present.  IMPRESSION: 2 cm inferior left hepatic laceration with adjacent hemorrhage/hematoma and small amount of blood within the abdomen.  Pancreatic body injury without discrete fracture. Some portions of the pancreatic body however are indistinct.  Multiple small left renal infarcts.  The left renal artery is grossly unremarkable but consider further evaluation as  clinically indicated.  Original Report Authenticated By: Lura Em, M.D.   Ct Abdomen Pelvis W Contrast  10/14/2011  *RADIOLOGY REPORT*  Clinical Data:  51 year old male with a crush injury and chest, abdomen, and pelvic pain.  CT CHEST, ABDOMEN AND PELVIS WITH CONTRAST  Technique:  Multidetector CT imaging of the chest, abdomen and pelvis was performed following the standard protocol during bolus administration of intravenous contrast.  Contrast: 140mL OMNIPAQUE IOHEXOL 300 MG/ML IJ SOLN  Comparison:  None  CT CHEST  Findings:  The heart and great vessels are within normal limits except for moderate coronary artery calcifications. There is no evidence of mediastinal hematoma, pneumothorax or pleural / pericardial effusions. No enlarged abnormal appearing lymph nodes are identified.  The lungs are clear except for minimal right basilar atelectasis. There is no evidence of airspace disease, consolidation, nodule/mass or endobronchial/endotracheal lesions. No acute or suspicious bony abnormalities are identified.  IMPRESSION: No evidence of acute injury to the chest.  Minimal right basilar atelectasis.  Coronary artery disease.  CT ABDOMEN AND PELVIS  Findings:  A 2 cm laceration of the inferior medial left hepatic segment, adjacent to the falciform ligament, is noted with adjacent hematoma and small amount of complex fluid/blood within the abdomen and paracolic gutters.  There is blood between the pancreatic body and splenic vein compatible with pancreatic injury.  No large fracture lines are identified but portions of the body are indistinct. There are several small infarcts within the left kidney.  The left renal artery is grossly unremarkable. Hemorrhage within the left retroperitoneum is identified. Hemorrhage/hematoma in the region of the gastrohepatic ligament and inferior to the left liver is noted.  There is no evidence of acute splenic abnormality. The adrenal glands, right kidney and gallbladder are  unremarkable.  There is no evidence of pneumoperitoneum. No bowel abnormalities identified except for changes of previous right colonic/small bowel surgery. There is no evidence of biliary dilatation or abdominal aortic aneurysm.  No acute or suspicious bony abnormalities are present.  IMPRESSION: 2 cm inferior left hepatic laceration with adjacent hemorrhage/hematoma and small amount of blood within the abdomen.  Pancreatic body injury without discrete fracture. Some portions of the pancreatic body however are indistinct.  Multiple small left renal infarcts.  The left renal artery is grossly unremarkable but consider further evaluation as clinically indicated.  Original Report Authenticated By: Lura Em, M.D.   Dg Pelvis Portable  10/14/2011  *RADIOLOGY REPORT*  Clinical Data: [place] between two steel rollers, crushing injury.  PORTABLE PELVIS  Comparison:  None.  Findings:  There is no evidence of pelvic fracture or diastasis. No other pelvic bone lesions are seen. Triangular radiopaque density at the intersection of the bladder and the right superior pubic bone felt to represent a phlebolith.  IMPRESSION: Negative.  Original Report Authenticated By: Staci Righter, M.D.   Dg Chest Port 1 View  10/14/2011  *RADIOLOGY REPORT*  Clinical Data: Crushing injury.  PORTABLE CHEST - 1 VIEW  Comparison: None.  Findings: Normal heart size with clear lung fields.  No bony abnormality.  No visible free air.  IMPRESSION: Negative.  Recommend CT chest, abdomen, and pelvis for further evaluation.  Original Report Authenticated By: Staci Righter, M.D.    Anti-infectives: Anti-infectives    None  Assessment/Plan: s/p * No surgery found * Patient Active Problem List  Diagnoses  . Pancreatic contusion  . Liver laceration  . Renal infarct  . Crohn disease    Crush to abd Pancreatic contusion- Lipase/Amylase normal and tolerating po, will advance diet Liver laceration- HD stable, Hgb stable, will  allow OOB to chair Renal infarcts- Renal function slightly worse, will continue MIVF and recheck in am Mild ABL anemia-follow  Crohn's Dz- not currently on meds Hx HTN- follow FEN- Hypokalemia- resolved, advance po and continue MIVF VTE- SCD's DISPO- Advance po, OOB to chair only, transfer to SDU   LOS: 2 days    Arcelia Pals,PA-C Pager [phone] General Trauma Pager [phone]

## 2011-10-17 LAB — CBC
HCT: 35.1 % — ABNORMAL LOW (ref 39.0–52.0)
MCH: 29.5 pg (ref 26.0–34.0)
MCHC: 34.8 g/dL (ref 30.0–36.0)
MCV: 84.8 fL (ref 78.0–100.0)
RDW: 13.6 % (ref 11.5–15.5)

## 2011-10-17 LAB — BASIC METABOLIC PANEL
BUN: 10 mg/dL (ref 6–23)
Calcium: 8.7 mg/dL (ref 8.4–10.5)
Creatinine, Ser: 1.27 mg/dL (ref 0.50–1.35)
GFR calc Af Amer: 75 mL/min — ABNORMAL LOW (ref >90)
GFR calc non Af Amer: 65 mL/min — ABNORMAL LOW (ref >90)

## 2011-10-17 MED ORDER — ONDANSETRON HCL 4 MG/2ML IJ SOLN
INTRAMUSCULAR | Status: AC
  Filled 2011-10-17: qty 2

## 2011-10-17 MED ORDER — ALUM & MAG HYDROXIDE-SIMETH 200-200-20 MG/5ML PO SUSP
30.0000 mL | Freq: Four times a day (QID) | ORAL | Status: DC | PRN
  Administered 2011-10-17 – 2011-10-18 (×2): 30 mL via ORAL
  Filled 2011-10-17 (×2): qty 30

## 2011-10-17 MED ORDER — ONDANSETRON HCL 4 MG/2ML IJ SOLN
4.0000 mg | Freq: Four times a day (QID) | INTRAMUSCULAR | Status: DC | PRN
  Administered 2011-10-17 (×2): 4 mg via INTRAVENOUS
  Filled 2011-10-17: qty 2

## 2011-10-17 MED ORDER — AMLODIPINE BESYLATE 2.5 MG PO TABS
2.5000 mg | ORAL_TABLET | Freq: Every day | ORAL | Status: DC
  Administered 2011-10-17 – 2011-10-20 (×4): 2.5 mg via ORAL
  Filled 2011-10-17 (×5): qty 1

## 2011-10-17 MED ORDER — DOCUSATE SODIUM 100 MG PO CAPS
100.0000 mg | ORAL_CAPSULE | Freq: Two times a day (BID) | ORAL | Status: DC
  Administered 2011-10-17 – 2011-10-20 (×6): 100 mg via ORAL
  Filled 2011-10-17 (×6): qty 1

## 2011-10-17 NOTE — Progress Notes (Signed)
Doing very well.  Will decrease IVFs.  Transfer okay.  This patient has been seen and I agree with the findings and treatment plan.  Kathryne Eriksson. Dahlia Bailiff, MD, [place] [phone] (pager) [phone] (direct pager) Trauma Surgeon

## 2011-10-17 NOTE — Progress Notes (Signed)
Patient ID: Darren Burnett, male   DOB: 19-Oct-1960, 51 y.o.   MRN: [id number]    Subjective: Did well with OOB to chair yesterday.  Requiring minimal pain medication.  Mainly c/o back pain.  Tolerating po well, passing flatus, but no BM. Will add bowel regimen. Some flashbacks of accident, but does not want to start medication for this right now.    Objective: Vital signs in last 24 hours: Temp:  [97.4 F (36.3 C)-97.8 F (36.6 C)] 97.8 F (36.6 C) (03/31 0400) Pulse Rate:  [50-68] 51  (03/31 0400) Resp:  [11-20] 13  (03/31 0400) BP: (110-142)/(82-88) 141/85 mmHg (03/31 0300) SpO2:  [97 %-100 %] 97 % (03/31 0400) Weight:  [83.008 kg (183 lb)] 83.008 kg (183 lb) (03/30 2300) Last BM Date: 10/15/11  Intake/Output from previous day: 03/30 0701 - 03/31 0700 In: 2455.3 [P.O.:720; I.V.:1735.3] Out: -  Intake/Output this shift:    General appearance: alert, cooperative, appears stated age and no distress Resp: clear to auscultation bilaterally Cardio: regular rate and rhythm and bradycardic at times GI: soft, non-tender; bowel sounds normal; no masses,  no organomegaly Neurologic: Grossly normal  Lab Results:   Basename 10/17/11 0430 10/16/11 0345  WBC 5.0 6.2  HGB 12.2* 12.7*  HCT 35.1* 36.7*  PLT 128* 126*   BMET  Basename 10/17/11 0430 10/16/11 0345  NA 140 141  K 4.2 3.9  CL 104 105  CO2 31 31  GLUCOSE 104* 102*  BUN 10 8  CREATININE 1.27 1.37*  CALCIUM 8.7 8.7   PT/INR  Basename 10/14/11 1721  LABPROT 13.3  INR 0.99   ABG No results found for this basename: PHART:2,PCO2:2,PO2:2,HCO3:2 in the last 72 hours  Studies/Results: No results found.  Anti-infectives: Anti-infectives    None      Assessment/Plan: s/p * No surgery found * Patient Active Problem List  Diagnoses  . Pancreatic contusion  . Liver laceration  . Renal infarct  . Crohn disease    Crush to abd Pancreatic contusion- Tolerating po well, regular diet Liver laceration- HD  stable, Hgb stable, will ambulate today Renal infarcts- Renal function improved, will NSL and encourage po fluids Mild ABL anemia-Stable Crohn's Dz- not currently on meds Hx HTN- follow FEN- tolerating regular diet, NSL VTE- SCD's DISPO- Ambulate today, transfer to floor. Plan DC in am if continues to do well   LOS: 3 days    Obediah Welles,PA-C Pager [phone] General Trauma Pager [phone]

## 2011-10-18 ENCOUNTER — Inpatient Hospital Stay (HOSPITAL_COMMUNITY): Payer: Worker's Compensation

## 2011-10-18 LAB — HEPATIC FUNCTION PANEL
AST: 29 U/L (ref 0–37)
Albumin: 3.5 g/dL (ref 3.5–5.2)
Total Protein: 6.7 g/dL (ref 6.0–8.3)

## 2011-10-18 LAB — CBC
Platelets: 149 10*3/uL — ABNORMAL LOW (ref 150–400)
RDW: 13.4 % (ref 11.5–15.5)
WBC: 6 10*3/uL (ref 4.0–10.5)

## 2011-10-18 LAB — BASIC METABOLIC PANEL
Chloride: 104 mEq/L (ref 96–112)
GFR calc Af Amer: 69 mL/min — ABNORMAL LOW (ref >90)
Potassium: 3.9 mEq/L (ref 3.5–5.1)

## 2011-10-18 LAB — AMYLASE: Amylase: 73 U/L (ref 0–105)

## 2011-10-18 MED ORDER — DOCUSATE SODIUM 100 MG PO CAPS
100.0000 mg | ORAL_CAPSULE | Freq: Every day | ORAL | Status: DC
Start: 2011-10-18 — End: 2011-10-18

## 2011-10-18 NOTE — Progress Notes (Signed)
Patient ID: Darren Burnett, male   DOB: 1961-06-28, 51 y.o.   MRN: [id number]    Subjective: Had been doing well until started to have some nausea last pm and increasing mid/epigastric abd pain  He has not had a BM since admit, and normally has loose stools due to his Crohn's. He did try to ambulate some yesterday, but reports his pain was worse and he felt very weak after this . Objective: Vital signs in last 24 hours: Temp:  [97.6 F (36.4 C)-98.4 F (36.9 C)] 98.1 F (36.7 C) (04/01 0955) Pulse Rate:  [50-61] 61  (04/01 0955) Resp:  [16-20] 20  (04/01 0955) BP: (129-179)/(74-109) 129/74 mmHg (04/01 0955) SpO2:  [98 %-100 %] 99 % (04/01 0955) Last BM Date: 10/14/11  Intake/Output from previous day: 03/31 0701 - 04/01 0700 In: 610 [P.O.:600; I.V.:10] Out: 2075 [Urine:2075] Intake/Output this shift: Total I/O In: 240 [P.O.:240] Out: 300 [Urine:300]  General appearance: alert, cooperative, appears stated age and no distress Resp: clear to auscultation bilaterally Cardio: regular rate and rhythm and bradycardic at times GI: soft, tender in epigastrium and peri-umbilical areas, some BS, mildly distended.  Neurologic: Grossly normal  Lab Results:   Basename 10/18/11 0626 10/17/11 0430  WBC 6.0 5.0  HGB 13.4 12.2*  HCT 38.0* 35.1*  PLT 149* 128*   BMET  Basename 10/18/11 0626 10/17/11 0430  NA 142 140  K 3.9 4.2  CL 104 104  CO2 31 31  GLUCOSE 88 104*  BUN 10 10  CREATININE 1.37* 1.27  CALCIUM 9.4 8.7   PT/INR No results found for this basename: LABPROT:2,INR:2 in the last 72 hours ABG No results found for this basename: PHART:2,PCO2:2,PO2:2,HCO3:2 in the last 72 hours  Studies/Results: Dg Chest 2 View  10/18/2011  *RADIOLOGY REPORT*  Clinical Data: Epigastric pain, shortness of breath  CHEST - 2 VIEW  Comparison: 10/14/2011  Findings: Cardiomediastinal silhouette is stable.  Small bilateral pleural effusion with bilateral basilar atelectasis.  No focal infiltrate  or pulmonary edema. Bony thorax is stable.  IMPRESSION: Small bilateral pleural effusion with mild basilar atelectasis.  No pulmonary edema of focal infiltrate.  Original Report Authenticated By: Lahoma Crocker, M.D.    Anti-infectives: Anti-infectives    None      Assessment/Plan: s/p * No surgery found * Patient Active Problem List  Diagnoses  . Pancreatic contusion  . Liver laceration  . Renal infarct  . Crohn disease    Crush to abd Pancreatic contusion- Will re ck amylase, lipase today, may need to re image if elevated again Liver laceration- HD stable, Hgb stable, improved Renal infarcts- Renal function slightly worse, may also be causing some of pt's pain. Mild ABL anemia-Stable, improved Crohn's Dz- not currently on meds Hx HTN- follow FEN- re ck labs as noted VTE- SCD's DISPO- reck labs, may need to re-image if elevated.  Pain definitely worse and new c/o nausea, so will plan to keep another day.      LOS: 4 days    Evia Goldsmith,PA-C Pager [phone] General Trauma Pager [phone]

## 2011-10-18 NOTE — Progress Notes (Signed)
Will recheck amylase and consider repeat CT. Pt had pancreatic contusion, concerned about possibility of pancreatic fluid collections.
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
## 2011-10-19 LAB — CBC
HCT: 36.4 % — ABNORMAL LOW (ref 39.0–52.0)
MCV: 84.3 fL (ref 78.0–100.0)
Platelets: 143 10*3/uL — ABNORMAL LOW (ref 150–400)
RBC: 4.32 MIL/uL (ref 4.22–5.81)
WBC: 5.2 10*3/uL (ref 4.0–10.5)

## 2011-10-19 LAB — BASIC METABOLIC PANEL
CO2: 31 mEq/L (ref 19–32)
Chloride: 103 mEq/L (ref 96–112)
Creatinine, Ser: 1.4 mg/dL — ABNORMAL HIGH (ref 0.50–1.35)

## 2011-10-19 LAB — AMYLASE: Amylase: 62 U/L (ref 0–105)

## 2011-10-19 LAB — LIPASE, BLOOD: Lipase: 25 U/L (ref 11–59)

## 2011-10-19 NOTE — Progress Notes (Signed)
Patient ID: Darren Burnett, male   DOB: [birth year], 51 y.o.   MRN: [id number]    Subjective: Still nauseated and not eating well. Mid to low back pain persists. Still feeling weak after he walks. He did have a BM yesterday Objective: Vital signs in last 24 hours: Temp:  [97.5 F (36.4 C)-98.3 F (36.8 C)] 97.5 F (36.4 C) (04/02 1000) Pulse Rate:  [56-68] 66  (04/02 1000) Resp:  [18-20] 19  (04/02 1000) BP: (119-143)/(74-93) 142/93 mmHg (04/02 1000) SpO2:  [98 %-100 %] 99 % (04/02 1000) Last BM Date: 10/18/11  Intake/Output from previous day: 04/01 0701 - 04/02 0700 In: 870 [P.O.:870] Out: 900 [Urine:900] Intake/Output this shift: Total I/O In: 240 [P.O.:240] Out: -   General appearance: alert, cooperative, mild distress Resp: clear to auscultation bilaterally Cardio: regular rate and rhythm and bradycardic at times GI: soft, tender in epigastrium and peri-umbilical areas, some BS, mildly distended.  Neurologic: Grossly normal Back with para spinal tenderness in low thoracic to lumbar areas Lab Results:   Basename 10/19/11 0518 10/18/11 0626  WBC 5.2 6.0  HGB 12.7* 13.4  HCT 36.4* 38.0*  PLT 143* 149*   BMET  Basename 10/19/11 0518 10/18/11 0626  NA 141 142  K 3.7 3.9  CL 103 104  CO2 31 31  GLUCOSE 98 88  BUN 11 10  CREATININE 1.40* 1.37*  CALCIUM 8.9 9.4   PT/INR No results found for this basename: LABPROT:2,INR:2 in the last 72 hours ABG No results found for this basename: PHART:2,PCO2:2,PO2:2,HCO3:2 in the last 72 hours  Studies/Results: Dg Chest 2 View  10/18/2011  *RADIOLOGY REPORT*  Clinical Data: Epigastric pain, shortness of breath  CHEST - 2 VIEW  Comparison: 10/14/2011  Findings: Cardiomediastinal silhouette is stable.  Small bilateral pleural effusion with bilateral basilar atelectasis.  No focal infiltrate or pulmonary edema. Bony thorax is stable.  IMPRESSION: Small bilateral pleural effusion with mild basilar atelectasis.  No pulmonary edema of  focal infiltrate.  Original Report Authenticated By: Lahoma Crocker, M.D.    Anti-infectives: Anti-infectives    None      Assessment/Plan: s/p * No surgery found * Patient Active Problem List  Diagnoses  . Pancreatic contusion  . Liver laceration  . Renal infarct  . Crohn disease    Crush to abd Pancreatic contusion- Will re ck amylase, lipase today, may need to re image if elevated again Liver laceration- HD stable, Hgb stable, improved Renal infarcts- Renal function slightly worse, may also be causing some of pt's pain. Small bilateral pleural effusions Mild ABL anemia-Stable, improved Crohn's Dz- not currently on meds Hx HTN- follow FEN- re ck labs in am VTE- SCD's DISPO- will obtain MRI scan       LOS: 5 days    Raihana Balderrama,PA-C Pager [phone] General Trauma Pager [phone]

## 2011-10-19 NOTE — Progress Notes (Signed)
Clinical Education officer, museum followed up with patient to provide community counseling resources in [place], [place] and [place], [place] per patient request.  Patient overly appreciative of resources and continues to plan to return home with his wife at discharge.  Patient unable to identify any further social work needs.  Clinical Social Worker will sign off for now as social work intervention is no longer needed. Please consult [place] again if new need arises.  [place], [place]

## 2011-10-20 ENCOUNTER — Inpatient Hospital Stay (HOSPITAL_COMMUNITY): Payer: Worker's Compensation

## 2011-10-20 MED ORDER — HYDROCODONE-ACETAMINOPHEN 5-325 MG PO TABS: 1.0000 | ORAL_TABLET | ORAL | Status: AC | PRN

## 2011-10-20 MED ORDER — GADOBENATE DIMEGLUMINE 529 MG/ML IV SOLN
15.0000 mL | Freq: Once | INTRAVENOUS | Status: AC
  Administered 2011-10-20: 15 mL via INTRAVENOUS

## 2011-10-20 NOTE — Progress Notes (Signed)
Patient ID: Darren Burnett, male   DOB: September 06, 1960, 51 y.o.   MRN: [id number]  I had a long discussion with Mr. Hudelson and his family.  Given the MRI results, I believe it is reasonable that he can go home and follow up as an out patient. He is eager to go home  Plan: discharge

## 2011-10-20 NOTE — Progress Notes (Signed)
Patient discharged to home with instructions, verbalized understanding, escorted by wife

## 2011-10-20 NOTE — Progress Notes (Signed)
  Subjective: Still complaining of some upper abdominal pain Tolerating po No nausea.  +flatus/bm  Objective: Vital signs in last 24 hours: Temp:  [97.6 F (36.4 C)-98.2 F (36.8 C)] 98.1 F (36.7 C) (04/03 0549) Pulse Rate:  [59-64] 64  (04/03 0549) Resp:  [16] 16  (04/03 0549) BP: (128-137)/(77-86) 128/86 mmHg (04/03 0549) SpO2:  [98 %-100 %] 98 % (04/03 0549) Last BM Date: 10/17/11  Intake/Output from previous day: 04/02 0701 - 04/03 0700 In: 480 [P.O.:480] Out: 450 [Urine:450] Intake/Output this shift:    Abdomen soft, non distended, minimally tender  Lab Results:   Basename 10/19/11 0518 10/18/11 0626  WBC 5.2 6.0  HGB 12.7* 13.4  HCT 36.4* 38.0*  PLT 143* 149*   BMET  Basename 10/19/11 0518 10/18/11 0626  NA 141 142  K 3.7 3.9  CL 103 104  CO2 31 31  GLUCOSE 98 88  BUN 11 10  CREATININE 1.40* 1.37*  CALCIUM 8.9 9.4   PT/INR No results found for this basename: LABPROT:2,INR:2 in the last 72 hours ABG No results found for this basename: PHART:2,PCO2:2,PO2:2,HCO3:2 in the last 72 hours  Studies/Results: Mr Abdomen W Wo Contrast  10/20/2011  *RADIOLOGY REPORT*  Clinical Data:  Crush injury, trauma, pancreatic and hepatic laceration  MRI ABDOMEN WITHOUT AND WITH CONTRAST  Technique:  Multiplanar multisequence MR imaging of the abdomen was performed both before and after the administration of intravenous contrast.  Contrast: 40mL MULTIHANCE GADOBENATE DIMEGLUMINE 529 MG/ML IV SOLN  Comparison:  CT abdomen/pelvis 10/14/2011  Findings:  Trace bilateral pleural effusions and trace abdominal ascites noted.  Trace fluid inferior to the pancreatic body is noted, image 19, series 4. Minimal linear discontinuity with internal T2-weighted hyperintensity is identified at the inferior aspect of the body of the pancreas, for example image 27 series 5, and image 66 of precontrast T1-weighted images.  This presumably corresponds to a previously seen pancreatic head laceration.   No pancreatic or hepatic ductal dilatation.  Trace perinephric fluid is identified. Areas of peripheral wedge- shaped hypoenhancement in the left greater than right kidneys are less apparent than previously.  No hydronephrosis or enhancing mass lesion.  There is only minimal inhomogeneous contrast enhancement at the site of previously evident hepatic laceration in the lateral segment left hepatic lobe, for example image 59 of immediate arterial phase postcontrast images, series 1201.  Trace fluid is noted between the left hepatic lobe and adjacent pancreas.  Spleen, gallbladder, adrenal glands are unremarkable.  No lymphadenopathy.  No abnormal enhancing mass lesion on postcontrast images.  IMPRESSION: Improvement in previously seen lateral segment left hepatic lobe and pancreatic body lacerations, with decreased surrounding free fluid and near complete interval resolution of previously seen areas of hypoperfusion to the left greater than right renal cortex.  No new abnormality.  Original Report Authenticated By: Arline Asp, M.D.    Anti-infectives: Anti-infectives    None      Assessment/Plan: s/p * No surgery found *  MRI shows all injuries improving.  No pseudocyst or abscess Will continue pain control Home soon  LOS: 6 days    Darren Burnett A 10/20/2011

## 2011-10-20 NOTE — Discharge Instructions (Signed)
No heavy lifting or strenuous activity  Ice pack or heating pad as needed  May use Ibuprofen or advil also for pain

## 2011-10-21 NOTE — Progress Notes (Signed)
Utilization review completed. Mylea Roarty Diane4/10/2011  

## 2011-10-25 ENCOUNTER — Telehealth: Payer: Self-pay | Admitting: Orthopedic Surgery

## 2011-10-25 NOTE — Telephone Encounter (Signed)
Left message to call back to set up appt 

## 2011-10-25 NOTE — Telephone Encounter (Signed)
Scheduled for appt 4/11

## 2011-10-28 ENCOUNTER — Encounter (INDEPENDENT_AMBULATORY_CARE_PROVIDER_SITE_OTHER): Payer: Self-pay

## 2011-10-28 ENCOUNTER — Ambulatory Visit (INDEPENDENT_AMBULATORY_CARE_PROVIDER_SITE_OTHER): Payer: Worker's Compensation | Admitting: Physician Assistant

## 2011-10-28 VITALS — BP 124/70 | HR 72 | Temp 96.9°F | Resp 18 | Ht 71.0 in | Wt 177.4 lb

## 2011-10-28 DIAGNOSIS — S29012A Strain of muscle and tendon of back wall of thorax, initial encounter: Secondary | ICD-10-CM

## 2011-10-28 DIAGNOSIS — N280 Ischemia and infarction of kidney: Secondary | ICD-10-CM

## 2011-10-28 DIAGNOSIS — N2889 Other specified disorders of kidney and ureter: Secondary | ICD-10-CM

## 2011-10-28 DIAGNOSIS — S36113A Laceration of liver, unspecified degree, initial encounter: Secondary | ICD-10-CM

## 2011-10-28 DIAGNOSIS — S36209A Unspecified injury of unspecified part of pancreas, initial encounter: Secondary | ICD-10-CM

## 2011-10-28 DIAGNOSIS — S239XXA Sprain of unspecified parts of thorax, initial encounter: Secondary | ICD-10-CM

## 2011-10-28 DIAGNOSIS — S36229A Contusion of unspecified part of pancreas, initial encounter: Secondary | ICD-10-CM

## 2011-10-28 MED ORDER — OXYCODONE-ACETAMINOPHEN 5-325 MG PO TABS: 1.0000 | ORAL_TABLET | ORAL | Status: AC | PRN

## 2011-10-28 MED ORDER — METHOCARBAMOL 500 MG PO TABS: 500.0000 mg | ORAL_TABLET | Freq: Four times a day (QID) | ORAL | Status: AC

## 2011-10-28 MED ORDER — PANTOPRAZOLE SODIUM 40 MG PO TBEC: 40.0000 mg | DELAYED_RELEASE_TABLET | Freq: Every day | ORAL | Status: DC
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
## 2011-10-28 NOTE — Patient Instructions (Signed)
Follow up April 25th, 2013 @ 2:00pm
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
## 2011-10-29 NOTE — Discharge Summary (Signed)
Atiba Kimberlin, MD, MPH, FACS Pager: [phone]  

## 2011-11-11 ENCOUNTER — Encounter: Payer: Self-pay | Admitting: Physician Assistant

## 2011-11-11 ENCOUNTER — Ambulatory Visit (INDEPENDENT_AMBULATORY_CARE_PROVIDER_SITE_OTHER): Payer: Worker's Compensation | Admitting: Physician Assistant

## 2011-11-11 VITALS — BP 124/76 | HR 76 | Ht 71.0 in | Wt 177.8 lb

## 2011-11-11 DIAGNOSIS — S335XXA Sprain of ligaments of lumbar spine, initial encounter: Secondary | ICD-10-CM

## 2011-11-11 DIAGNOSIS — N280 Ischemia and infarction of kidney: Secondary | ICD-10-CM

## 2011-11-11 DIAGNOSIS — S36229A Contusion of unspecified part of pancreas, initial encounter: Secondary | ICD-10-CM

## 2011-11-11 DIAGNOSIS — S36209A Unspecified injury of unspecified part of pancreas, initial encounter: Secondary | ICD-10-CM

## 2011-11-11 DIAGNOSIS — S36113A Laceration of liver, unspecified degree, initial encounter: Secondary | ICD-10-CM

## 2011-11-11 DIAGNOSIS — S39012A Strain of muscle, fascia and tendon of lower back, initial encounter: Secondary | ICD-10-CM | POA: Insufficient documentation

## 2011-11-11 DIAGNOSIS — N2889 Other specified disorders of kidney and ureter: Secondary | ICD-10-CM

## 2011-11-11 NOTE — Patient Instructions (Signed)
Evaluation by an orthopedic surgeon prior to starting Physical Therapy.  Follow up with Trauma as needed, but feel free to call for questions or concerns. [phone]
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
## 2011-11-25 ENCOUNTER — Telehealth: Payer: Self-pay | Admitting: Orthopedic Surgery

## 2011-11-25 NOTE — Telephone Encounter (Signed)
Worker's comp carrier wanted to know if he had been cleared for PT by orthopedist. I told her I didn't know. They have a call in to his case manager as well.

## 2011-12-29 ENCOUNTER — Telehealth: Payer: Self-pay | Admitting: Orthopedic Surgery

## 2011-12-29 NOTE — Telephone Encounter (Signed)
Case manager called to say patient was having a lot of left flank pain that was radiating into the groin and the orthopedist wanted him seen again by [place]. I suggested a CT scan and then we would determine the next best course of action. I emailed her ([email or web address]) an order for her to arrange.

## 2012-01-04 ENCOUNTER — Telehealth: Payer: Self-pay | Admitting: Orthopedic Surgery

## 2012-01-04 NOTE — Telephone Encounter (Signed)
I informed Darren Burnett's case manager that his CT scan was normal so I did not think the patient's pain was coming from an internal source.

## 2012-02-10 ENCOUNTER — Encounter (INDEPENDENT_AMBULATORY_CARE_PROVIDER_SITE_OTHER): Payer: Self-pay

## 2012-10-05 IMAGING — CR DG CHEST 2V
2 series · 2 of 2 positions shown · non-contrast
Comparison: 10/14/2011

CLINICAL DATA: Epigastric pain, shortness of breath

CHEST - 2 VIEW

[w chest pa]
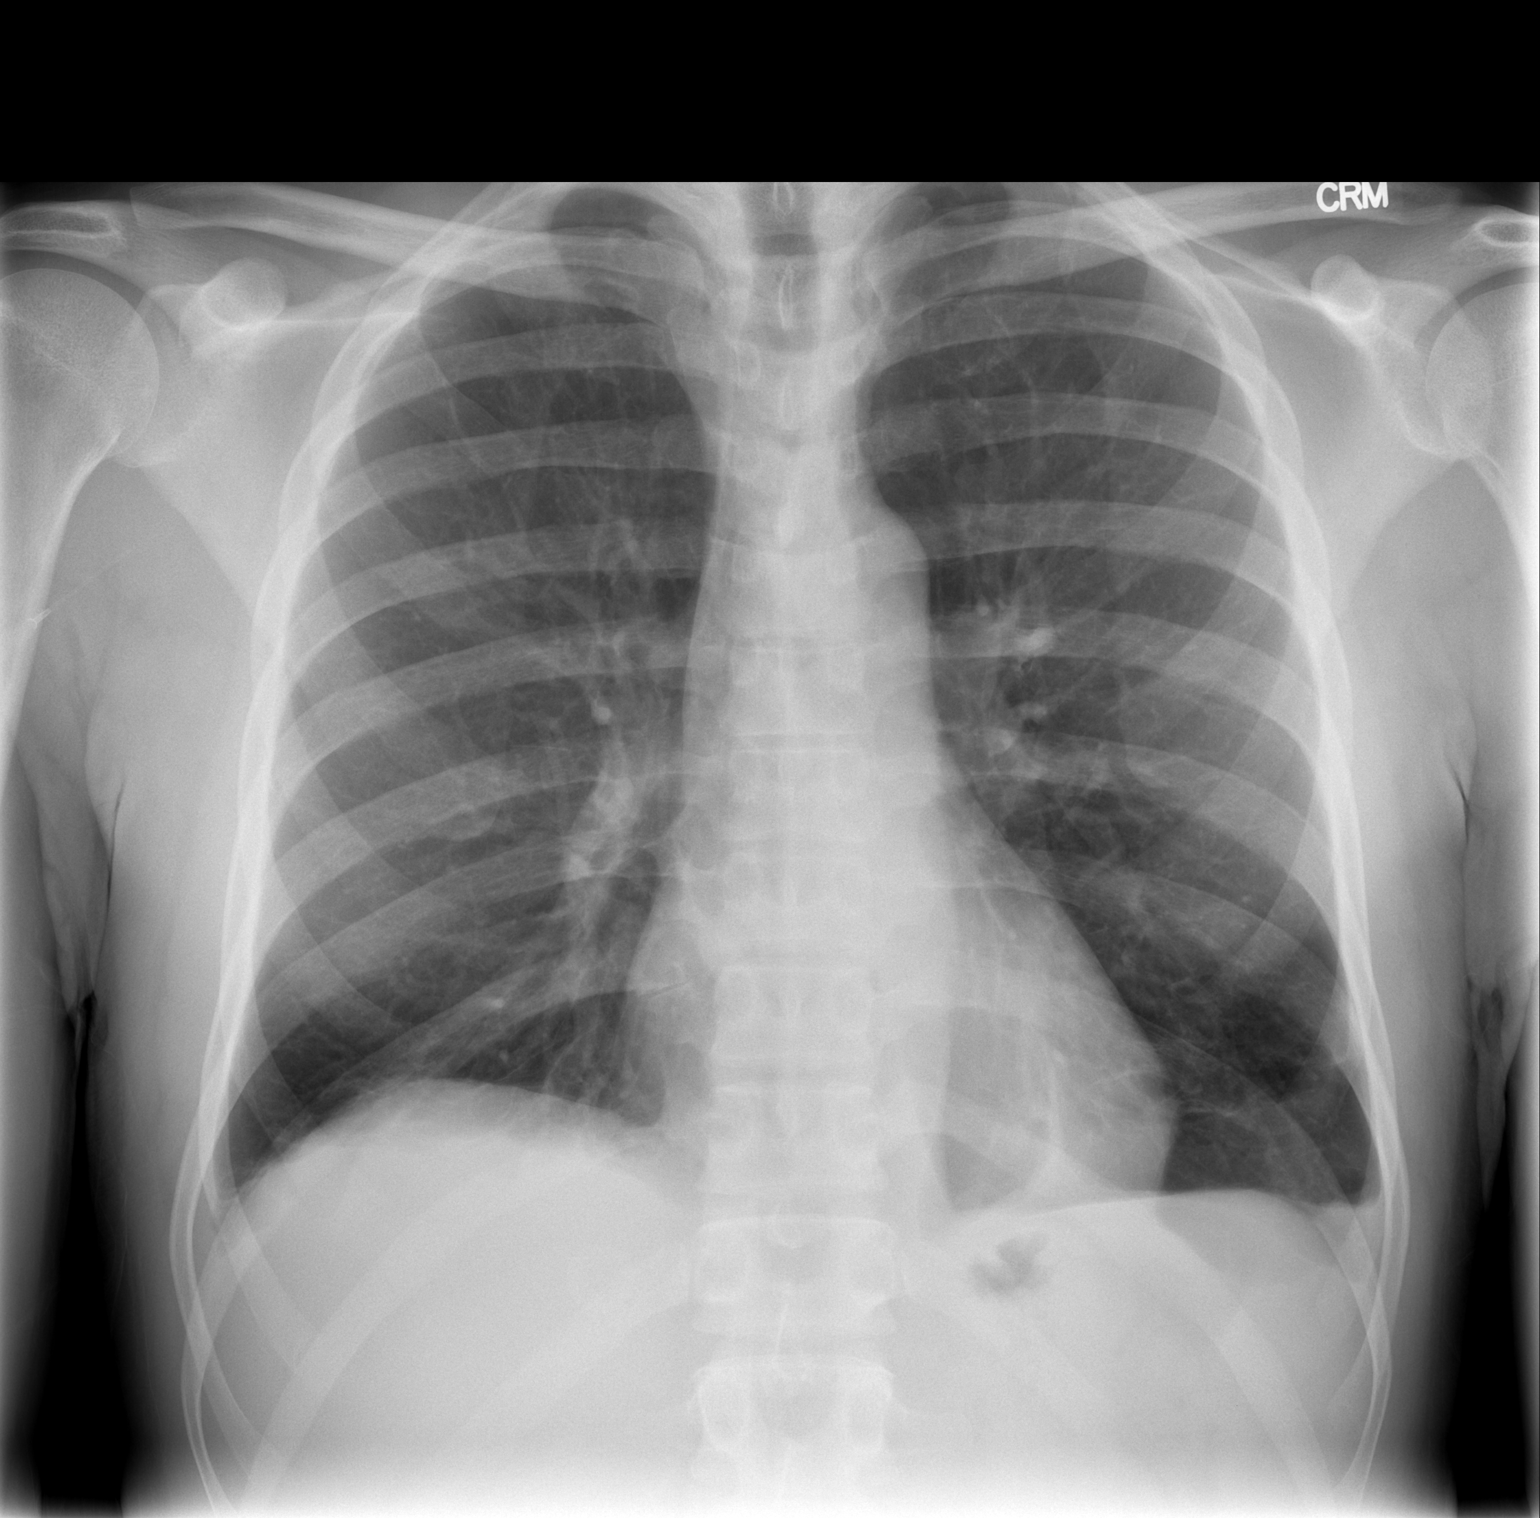

[w chest lat]
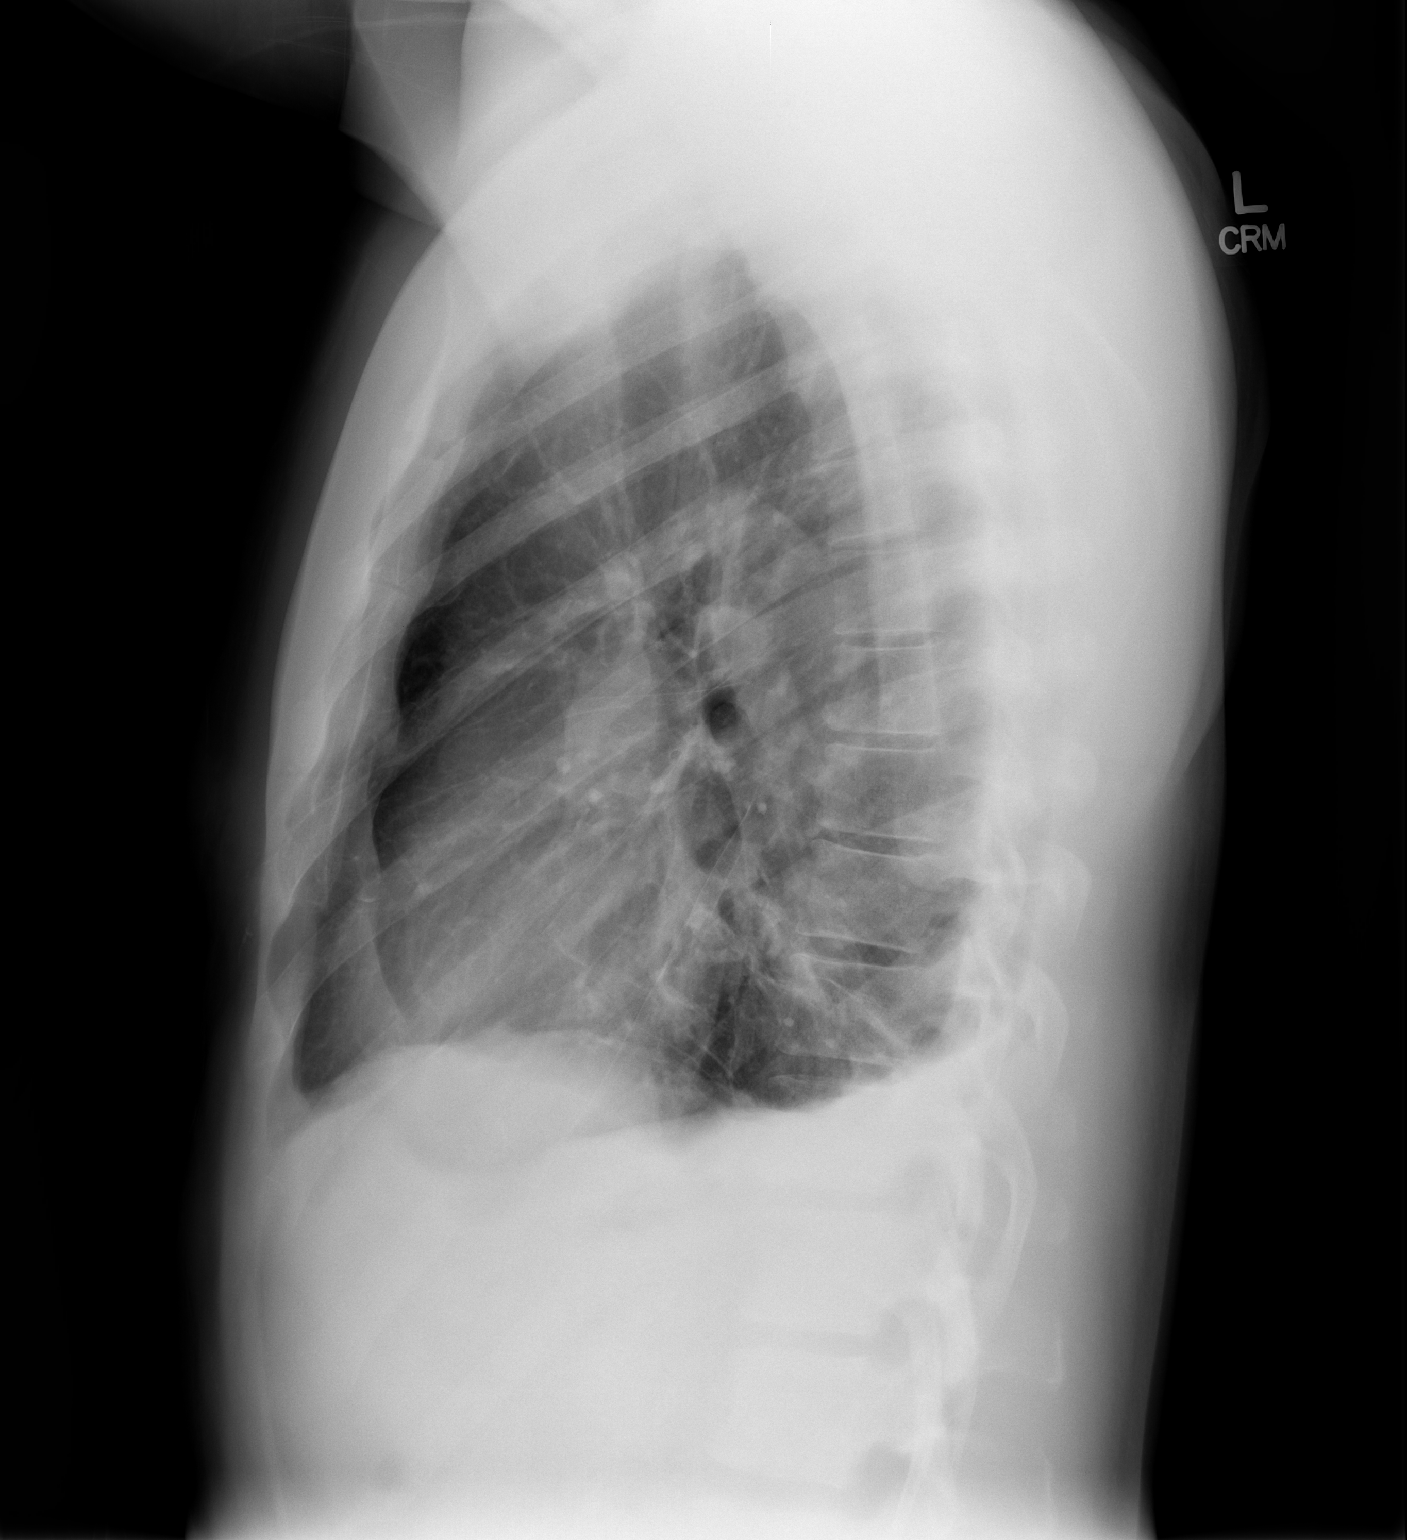

[2 of 2 positions shown; findings below may reference images not displayed]

FINDINGS: Cardiomediastinal silhouette is stable.  Small bilateral
pleural effusion with bilateral basilar atelectasis.  No focal
infiltrate or pulmonary edema. Bony thorax is stable.
IMPRESSION: Small bilateral pleural effusion with mild basilar atelectasis.  No
pulmonary edema of focal infiltrate.

## 2014-04-26 ENCOUNTER — Ambulatory Visit (HOSPITAL_COMMUNITY): Payer: Self-pay | Admitting: Psychiatry

## 2014-10-14 ENCOUNTER — Ambulatory Visit (HOSPITAL_COMMUNITY): Payer: Self-pay | Admitting: Psychiatry

## 2014-10-24 ENCOUNTER — Encounter (HOSPITAL_COMMUNITY): Payer: Self-pay | Admitting: Psychiatry

## 2014-10-24 ENCOUNTER — Ambulatory Visit (INDEPENDENT_AMBULATORY_CARE_PROVIDER_SITE_OTHER): Payer: BLUE CROSS/BLUE SHIELD | Admitting: Psychiatry

## 2014-10-24 VITALS — BP 144/94 | HR 58 | Ht 71.0 in | Wt 175.0 lb

## 2014-10-24 DIAGNOSIS — F411 Generalized anxiety disorder: Secondary | ICD-10-CM | POA: Diagnosis not present

## 2014-10-24 DIAGNOSIS — F418 Other specified anxiety disorders: Secondary | ICD-10-CM

## 2014-10-24 MED ORDER — CLONAZEPAM 0.5 MG PO TABS: 0.5000 mg | ORAL_TABLET | Freq: Three times a day (TID) | ORAL | Status: DC
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
## 2014-11-20 ENCOUNTER — Ambulatory Visit (INDEPENDENT_AMBULATORY_CARE_PROVIDER_SITE_OTHER): Payer: BLUE CROSS/BLUE SHIELD | Admitting: Psychiatry

## 2014-11-20 ENCOUNTER — Encounter (HOSPITAL_COMMUNITY): Payer: Self-pay | Admitting: Psychiatry

## 2014-11-20 VITALS — BP 143/86 | HR 61 | Ht 71.0 in | Wt 174.2 lb

## 2014-11-20 DIAGNOSIS — F411 Generalized anxiety disorder: Secondary | ICD-10-CM

## 2014-11-20 DIAGNOSIS — F418 Other specified anxiety disorders: Secondary | ICD-10-CM | POA: Diagnosis not present
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
## 2014-11-25 ENCOUNTER — Ambulatory Visit (INDEPENDENT_AMBULATORY_CARE_PROVIDER_SITE_OTHER): Payer: BLUE CROSS/BLUE SHIELD | Admitting: Psychiatry

## 2014-11-25 ENCOUNTER — Encounter (HOSPITAL_COMMUNITY): Payer: Self-pay | Admitting: Psychiatry

## 2014-11-25 DIAGNOSIS — F411 Generalized anxiety disorder: Secondary | ICD-10-CM

## 2014-11-25 NOTE — Patient Instructions (Signed)
Discussed orally 
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
## 2014-12-02 ENCOUNTER — Telehealth (HOSPITAL_COMMUNITY): Payer: Self-pay | Admitting: *Deleted

## 2014-12-02 ENCOUNTER — Ambulatory Visit (INDEPENDENT_AMBULATORY_CARE_PROVIDER_SITE_OTHER): Payer: BLUE CROSS/BLUE SHIELD | Admitting: Psychiatry

## 2014-12-02 DIAGNOSIS — F411 Generalized anxiety disorder: Secondary | ICD-10-CM | POA: Diagnosis not present

## 2014-12-02 NOTE — Patient Instructions (Signed)
Discussed orally 
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
## 2014-12-02 NOTE — Telephone Encounter (Signed)
Pt called stating that he would like to see if office could try to fit him on Peggy's schedule for 12-03-14. Per pt, Peggy told him if he need to talk to her, she could fit him on her schedule. Informed pt that [facility] do not have any opening for 12-03-14 and offered to put pt on wait list but he stated that Peggy told him that if he need to talk to her, she would put him on the schedule. Pt number is [phone].

## 2014-12-02 NOTE — Telephone Encounter (Signed)
Pt called stating that he would like to see if office could try to fit him on Peggy's schedule for 12-03-14. Per pt, Peggy told him if he need to talk to her, she could fit him on her schedule. Informed pt that [facility] do not have any opening for 12-03-14 and offered to put pt on wait list but he stated that Peggy told him that if he need to talk to her, she would put him on the schedule. Pt number is [phone].

## 2014-12-02 NOTE — Telephone Encounter (Signed)
Informed pt of scheduling him for an appt 12-02-14 and pt agreed with appt.../or

## 2014-12-02 NOTE — Telephone Encounter (Signed)
Please call patient and inform him I have an appointment available today at 2:00 PM.

## 2014-12-05 ENCOUNTER — Ambulatory Visit (INDEPENDENT_AMBULATORY_CARE_PROVIDER_SITE_OTHER): Payer: BLUE CROSS/BLUE SHIELD | Admitting: Psychiatry

## 2014-12-05 ENCOUNTER — Encounter (HOSPITAL_COMMUNITY): Payer: Self-pay | Admitting: Psychiatry

## 2014-12-05 VITALS — BP 151/83 | HR 55 | Ht 71.0 in | Wt 169.2 lb

## 2014-12-05 DIAGNOSIS — F411 Generalized anxiety disorder: Secondary | ICD-10-CM | POA: Diagnosis not present

## 2014-12-05 MED ORDER — DULOXETINE HCL 30 MG PO CPEP: 30.0000 mg | ORAL_CAPSULE | Freq: Every day | ORAL | Status: DC
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
## 2014-12-09 ENCOUNTER — Ambulatory Visit (INDEPENDENT_AMBULATORY_CARE_PROVIDER_SITE_OTHER): Payer: BLUE CROSS/BLUE SHIELD | Admitting: Psychiatry

## 2014-12-09 DIAGNOSIS — F411 Generalized anxiety disorder: Secondary | ICD-10-CM
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
## 2014-12-09 NOTE — Patient Instructions (Signed)
Discussed orally 

## 2014-12-19 ENCOUNTER — Ambulatory Visit (HOSPITAL_COMMUNITY): Payer: BLUE CROSS/BLUE SHIELD | Admitting: Psychiatry

## 2014-12-23 ENCOUNTER — Ambulatory Visit (HOSPITAL_COMMUNITY): Payer: Self-pay | Admitting: Psychiatry

## 2015-01-06 ENCOUNTER — Ambulatory Visit (HOSPITAL_COMMUNITY): Payer: Self-pay | Admitting: Psychiatry

## 2015-01-07 ENCOUNTER — Encounter (HOSPITAL_COMMUNITY): Payer: Self-pay | Admitting: Psychiatry

## 2015-01-07 ENCOUNTER — Ambulatory Visit (INDEPENDENT_AMBULATORY_CARE_PROVIDER_SITE_OTHER): Payer: BLUE CROSS/BLUE SHIELD | Admitting: Psychiatry

## 2015-01-07 VITALS — BP 138/85 | HR 70 | Ht 71.0 in | Wt 169.4 lb

## 2015-01-07 DIAGNOSIS — F411 Generalized anxiety disorder: Secondary | ICD-10-CM

## 2015-01-07 DIAGNOSIS — F418 Other specified anxiety disorders: Secondary | ICD-10-CM | POA: Diagnosis not present
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
## 2015-01-27 ENCOUNTER — Ambulatory Visit (INDEPENDENT_AMBULATORY_CARE_PROVIDER_SITE_OTHER): Payer: BLUE CROSS/BLUE SHIELD | Admitting: Psychiatry

## 2015-01-27 DIAGNOSIS — F411 Generalized anxiety disorder: Secondary | ICD-10-CM | POA: Diagnosis not present
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
## 2015-01-27 NOTE — Patient Instructions (Signed)
Discussed orally 

## 2015-01-31 ENCOUNTER — Ambulatory Visit (HOSPITAL_COMMUNITY): Payer: Self-pay | Admitting: Psychiatry

## 2015-02-17 ENCOUNTER — Ambulatory Visit (HOSPITAL_COMMUNITY): Payer: Self-pay | Admitting: Psychiatry

## 2015-02-20 ENCOUNTER — Ambulatory Visit (HOSPITAL_COMMUNITY): Payer: Self-pay | Admitting: Psychiatry

## 2015-02-27 ENCOUNTER — Other Ambulatory Visit (HOSPITAL_COMMUNITY): Payer: Self-pay | Admitting: Psychiatry

## 2015-03-04 ENCOUNTER — Ambulatory Visit (INDEPENDENT_AMBULATORY_CARE_PROVIDER_SITE_OTHER): Payer: BLUE CROSS/BLUE SHIELD | Admitting: Psychiatry

## 2015-03-04 DIAGNOSIS — F411 Generalized anxiety disorder: Secondary | ICD-10-CM
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
## 2015-03-04 NOTE — Patient Instructions (Signed)
Discussed orally 

## 2015-03-19 ENCOUNTER — Encounter (HOSPITAL_COMMUNITY): Payer: Self-pay | Admitting: Psychiatry

## 2015-03-19 ENCOUNTER — Ambulatory Visit (INDEPENDENT_AMBULATORY_CARE_PROVIDER_SITE_OTHER): Payer: BLUE CROSS/BLUE SHIELD | Admitting: Psychiatry

## 2015-03-19 ENCOUNTER — Telehealth (HOSPITAL_COMMUNITY): Payer: Self-pay | Admitting: *Deleted

## 2015-03-19 DIAGNOSIS — F411 Generalized anxiety disorder: Secondary | ICD-10-CM | POA: Diagnosis not present

## 2015-03-19 NOTE — Progress Notes (Signed)
THERAPIST PROGRESS NOTE  Session Time:  Wednesday 03/19/2015 3:20 PM - 3:55 PM  Participation Level: Active  Behavioral Response: CasualAlert/ depressed/anxious  Type of Therapy: Individual Therapy  Treatment Goals:    1. Learn and implement behavioral strategies to overcome depression and resume normal interest in activities       2. Process and resolve grief and loss issues related to marital separation identifying and verbalizing feelings       3. Learning implement behavioral strategies to decrease intensity and frequency of anxiety response (ruminating thoughts and panic attacks)       4. Identify and replace negative thoughts that supportive depression and anxiety  Treatment Goals addressed:  1,4  Interventions: Supportive,CBT  Summary: Darren Burnett is a 54 y.o. male who is referred for services by psychiatrist Dr. Harrington Challenger due to patient experiencing symptoms of anxiety and depression. Patient reports anxiety for most of his life but says it has increased since his wife left him for the second time  in March 2016. The first time she left was in 2012. They have been married for 26 years. He states wife left because she was unhappy. He reports she also has anxiety and depression too. Patient  blames himself for wife leaving and states she  wanted to go out and do things but patient reports being nervous around crowds throughout his life and preferring to be at home.  Patient reports becoming lightheaded and dizzy when he is in a crowd. He says his father was like this as well and suffered from schizophrenia. Patient reports being paranoid and very cautious. He thinks people are talking about him if he sees people looking at him. He also states always watching out the window at home when he hears loud music as he fears something may happen. Patient reports depression and feeling hopeless.  Patient has not worked since March 2013 when he was crushed under a hydraulic shaft which caused  contusions in pancreas and kidneys. He also says liver was lacerated. He reports financial stress as he received money from a settlement but says it is almost gone. He has appealed an earlier disability denial and has as been informed by his attorney it will be at least 7-8 months before he hears anything.Patient also reports stress related to having Crohn's disease. He was diagnosed in 63. He has had 4 bowel resections. The last surgery was in October 2015.  Patient reports continued stress, anxiety, and depressed mood since last session. He is experiencing increasing difficulty trying to push himself having little to no interest in activities  and states sometimes just sitting around or lying down. He continues to visit his mother and date occasionally.  He reports no appetite.He continues to experience excessive worry and ruminates about wife being involved with another man. Patient reports he took Cymbalta for a few days after last session but states he didn't like the way it made him feel. He did not follow up with psychiatrist Dr. Harrington Challenger as agreed and does not have another appointment.   Suicidal/Homicidal: No.  Therapist Response: Therapist works with patient to process feelings, discuss the rationale for medication compliance, encourage patient to schedule an appointment and discuss medication concerns with psychiatrist Dr. Harrington Challenger, identify ways to improve daily structure and routine, review and practice focused breathing as a relaxation technique  Plan: Return again in 2 weeks.Patient agrees to use daily planning forms and bring to next session, practice relaxation breathing daily, and schedule an appointment with psychiatrist  Dr. Harrington Challenger  Diagnosis: Axis I: Generalized Anxiety Disorder    Axis II: Deferred    Jillienne Egner, LCSW 03/19/2015

## 2015-03-19 NOTE — Patient Instructions (Signed)
Discussed orally 

## 2015-04-02 ENCOUNTER — Encounter (HOSPITAL_COMMUNITY): Payer: Self-pay | Admitting: Psychiatry

## 2015-04-02 ENCOUNTER — Ambulatory Visit (HOSPITAL_COMMUNITY): Payer: Self-pay | Admitting: Psychiatry

## 2015-04-11 ENCOUNTER — Ambulatory Visit (HOSPITAL_COMMUNITY): Payer: Self-pay | Admitting: Psychiatry

## 2015-04-18 ENCOUNTER — Ambulatory Visit (HOSPITAL_COMMUNITY): Payer: Self-pay | Admitting: Psychiatry

## 2015-04-21 ENCOUNTER — Ambulatory Visit (HOSPITAL_COMMUNITY): Payer: Self-pay | Admitting: Psychiatry

## 2015-04-22 ENCOUNTER — Ambulatory Visit (HOSPITAL_COMMUNITY): Payer: Self-pay | Admitting: Psychiatry

## 2015-04-29 ENCOUNTER — Ambulatory Visit (INDEPENDENT_AMBULATORY_CARE_PROVIDER_SITE_OTHER): Payer: BLUE CROSS/BLUE SHIELD | Admitting: Psychiatry

## 2015-04-29 ENCOUNTER — Encounter (HOSPITAL_COMMUNITY): Payer: Self-pay | Admitting: Psychiatry

## 2015-04-29 DIAGNOSIS — F411 Generalized anxiety disorder: Secondary | ICD-10-CM | POA: Diagnosis not present

## 2015-04-29 NOTE — Patient Instructions (Signed)
Discussed orally 
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
## 2015-05-13 ENCOUNTER — Ambulatory Visit (HOSPITAL_COMMUNITY): Payer: Self-pay | Admitting: Psychiatry

## 2015-05-19 ENCOUNTER — Ambulatory Visit (INDEPENDENT_AMBULATORY_CARE_PROVIDER_SITE_OTHER): Payer: BLUE CROSS/BLUE SHIELD | Admitting: Psychiatry

## 2015-05-19 ENCOUNTER — Encounter (HOSPITAL_COMMUNITY): Payer: Self-pay | Admitting: Psychiatry

## 2015-05-19 VITALS — BP 146/77 | HR 77 | Ht 71.0 in | Wt 165.6 lb

## 2015-05-19 DIAGNOSIS — F411 Generalized anxiety disorder: Secondary | ICD-10-CM | POA: Diagnosis not present

## 2015-05-19 DIAGNOSIS — F4010 Social phobia, unspecified: Secondary | ICD-10-CM | POA: Diagnosis not present

## 2015-05-19 MED ORDER — VILAZODONE HCL 40 MG PO TABS: 40.0000 mg | ORAL_TABLET | Freq: Every day | ORAL | Status: DC
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
## 2015-05-21 ENCOUNTER — Telehealth (HOSPITAL_COMMUNITY): Payer: Self-pay | Admitting: *Deleted

## 2015-05-21 NOTE — Telephone Encounter (Signed)
lmtcb number provided 

## 2015-05-21 NOTE — Telephone Encounter (Signed)
voice message from patient, Darren Burnett is too expensive.    Can he be put on something else.

## 2015-05-22 ENCOUNTER — Telehealth (HOSPITAL_COMMUNITY): Payer: Self-pay | Admitting: *Deleted

## 2015-05-22 NOTE — Telephone Encounter (Signed)
PATIENT WANTS TO GO BACK ON THE CYMBALTA.  THE OTHER MEDICINE IS TOO EXPENSIVE.

## 2015-05-23 ENCOUNTER — Other Ambulatory Visit (HOSPITAL_COMMUNITY): Payer: Self-pay | Admitting: Psychiatry

## 2015-05-23 MED ORDER — DULOXETINE HCL 30 MG PO CPEP: 30.0000 mg | ORAL_CAPSULE | Freq: Every day | ORAL | Status: DC

## 2015-05-23 NOTE — Telephone Encounter (Signed)
cymbalta sent to pharmacy

## 2015-05-23 NOTE — Telephone Encounter (Signed)
Message was sent to provider. 

## 2015-05-23 NOTE — Telephone Encounter (Signed)
Called pt and informed him of what Dr. Harrington Challenger did and pt showed understanding.

## 2015-05-23 NOTE — Telephone Encounter (Signed)
Pt called stating he can not afford his Viibryd. Asked pt if he have a discount card and he stated yes but he don't want to get use to a medication that he can not afford later on. Called Viirbyrd rep and he stated that the discount cards can be used every month for 1 year. Called pt and informed him of this and pt stated he can not afford anything more then $10.00 right now because for all of his medications, he's only paying $10 for each of them. Offered pt to try to see if he could apply for patient assistance for this medication and pt still refused and stated he would think about it. Per pt, he would like to go back on his Cymbalta. Per pt, he think he stopped it too early and didn't give the medication a chance to probably work. Per pt, with this medication he only had to pay $10. Per pt, he can not afford the Viibryd right now. Informed pt that request for Cymbalta will be sent to Dr. Harrington Challenger and pt agreed. Pt number is [phone].

## 2015-05-27 ENCOUNTER — Ambulatory Visit (INDEPENDENT_AMBULATORY_CARE_PROVIDER_SITE_OTHER): Payer: BLUE CROSS/BLUE SHIELD | Admitting: Psychiatry

## 2015-05-27 ENCOUNTER — Encounter (HOSPITAL_COMMUNITY): Payer: Self-pay | Admitting: Psychiatry

## 2015-05-27 DIAGNOSIS — F411 Generalized anxiety disorder: Secondary | ICD-10-CM

## 2015-05-27 NOTE — Patient Instructions (Signed)
Discussed orally 
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
## 2015-06-25 ENCOUNTER — Encounter (HOSPITAL_COMMUNITY): Payer: Self-pay | Admitting: Psychiatry

## 2015-06-25 ENCOUNTER — Ambulatory Visit (INDEPENDENT_AMBULATORY_CARE_PROVIDER_SITE_OTHER): Payer: BLUE CROSS/BLUE SHIELD | Admitting: Psychiatry

## 2015-06-25 DIAGNOSIS — F411 Generalized anxiety disorder: Secondary | ICD-10-CM | POA: Diagnosis not present
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
## 2015-06-25 NOTE — Patient Instructions (Signed)
Discussed orally 

## 2015-06-30 ENCOUNTER — Ambulatory Visit (HOSPITAL_COMMUNITY): Payer: Self-pay | Admitting: Psychiatry

## 2015-07-17 ENCOUNTER — Encounter (HOSPITAL_COMMUNITY): Payer: Self-pay | Admitting: Psychiatry

## 2015-07-17 ENCOUNTER — Ambulatory Visit (INDEPENDENT_AMBULATORY_CARE_PROVIDER_SITE_OTHER): Payer: BLUE CROSS/BLUE SHIELD | Admitting: Psychiatry

## 2015-07-17 VITALS — BP 125/70 | HR 61 | Ht 71.0 in | Wt 168.0 lb

## 2015-07-17 DIAGNOSIS — F411 Generalized anxiety disorder: Secondary | ICD-10-CM

## 2015-07-17 DIAGNOSIS — F4010 Social phobia, unspecified: Secondary | ICD-10-CM

## 2015-07-17 MED ORDER — LORAZEPAM 0.5 MG PO TABS: 0.5000 mg | ORAL_TABLET | Freq: Every day | ORAL | Status: DC | PRN

## 2015-07-17 MED ORDER — DULOXETINE HCL 30 MG PO CPEP: 30.0000 mg | ORAL_CAPSULE | Freq: Every day | ORAL | Status: DC
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
## 2015-07-25 ENCOUNTER — Ambulatory Visit (INDEPENDENT_AMBULATORY_CARE_PROVIDER_SITE_OTHER): Payer: BLUE CROSS/BLUE SHIELD | Admitting: Psychiatry

## 2015-07-25 ENCOUNTER — Encounter (HOSPITAL_COMMUNITY): Payer: Self-pay | Admitting: Psychiatry

## 2015-07-25 DIAGNOSIS — F411 Generalized anxiety disorder: Secondary | ICD-10-CM | POA: Diagnosis not present

## 2015-07-25 NOTE — Patient Instructions (Signed)
Discussed orally 
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
## 2015-08-15 ENCOUNTER — Ambulatory Visit (INDEPENDENT_AMBULATORY_CARE_PROVIDER_SITE_OTHER): Payer: BLUE CROSS/BLUE SHIELD | Admitting: Psychiatry

## 2015-08-15 ENCOUNTER — Encounter (HOSPITAL_COMMUNITY): Payer: Self-pay | Admitting: Psychiatry

## 2015-08-15 DIAGNOSIS — F411 Generalized anxiety disorder: Secondary | ICD-10-CM | POA: Diagnosis not present

## 2015-08-15 NOTE — Progress Notes (Signed)
THERAPIST PROGRESS NOTE  Session Time:  Friday   08/15/2015 2:15 PM -   2:59 PM         Participation Level: Active  Behavioral Response: CasualAlert/ depressed/anxious  Type of Therapy: Individual Therapy  Treatment Goals:    1. Learn and implement behavioral strategies to overcome depression and resume normal interest in activities       2. Process and resolve grief and loss issues related to marital separation identifying and verbalizing feelings       3. Learning implement behavioral strategies to decrease intensity and frequency of anxiety response (ruminating thoughts and panic attacks)       4. Identify and replace negative thoughts that supportive depression and anxiety  Treatment Goals addressed:  1,4  Interventions: Supportive,CBT  Summary: Darren Burnett is a 55 y.o. male who is referred for services by psychiatrist Dr. Harrington Challenger due to patient experiencing symptoms of anxiety and depression. Patient reports anxiety for most of his life but says it has increased since his wife left him for the second time  in March 2016. The first time she left was in 2012. They have been married for 26 years. He states wife left because she was unhappy. He reports she also has anxiety and depression too. Patient  blames himself for wife leaving and states she  wanted to go out and do things but patient reports being nervous around crowds throughout his life and preferring to be at home.  Patient reports becoming lightheaded and dizzy when he is in a crowd. He says his father was like this as well and suffered from schizophrenia. Patient reports being paranoid and very cautious. He thinks people are talking about him if he sees people looking at him. He also states always watching out the window at home when he hears loud music as he fears something may happen. Patient reports depression and feeling hopeless.  Patient has not worked since March 2013 when he was crushed under a hydraulic shaft  which caused contusions in pancreas and kidneys. He also says liver was lacerated. He reports financial stress as he received money from a settlement but says it is almost gone. He has appealed an earlier disability denial and has as been informed by his attorney it will be at least 7-8 months before he hears anything.Patient also reports stress related to having Crohn's disease. He was diagnosed in 29. He has had 4 bowel resections. The last surgery was in October 2015.  Patient reports continued stress and excessive worry. He is experiencing some relief as he has begun receiving his pension benefits. However, he is still worried about finances as he has not been approved for disability income. He recently was offered the opportunity to have disability hearing by video. He is consulting with lawyers regarding this. He continues to worry about health and various other issues.  He admits continued negative thinking anticipating the worst using a lot of what if statements. He has been playing his keyboard as a relaxation technique.  He has done scripture hunt but has not been using information on regular basis. He has not contacted spiritual mentors as he worries they may be too busy.  .  Suicidal/Homicidal: No.  Therapist Response: Therapist works with patient to praise and reinforce patient's efforts to use keyboard as relaxation technique, praise and reinforce compliance with medication,  process feelings, review connection between thoughts, feelings, and mood, identify ways he could nurture spirituality consistentlyi such as  doing a scripture hunt regarding anxiety and peace, identify and challenge cognitive distortions regarding talking to spiritual mentors   Plan: Return again in 2-3  weeks.  Diagnosis: Axis I: Generalized Anxiety Disorder    Axis II: Deferred    Amorita Vanrossum, LCSW 08/15/2015

## 2015-08-15 NOTE — Patient Instructions (Signed)
Discussed orally 

## 2015-09-04 ENCOUNTER — Ambulatory Visit (HOSPITAL_COMMUNITY): Payer: Self-pay | Admitting: Psychiatry

## 2015-09-12 ENCOUNTER — Encounter (HOSPITAL_COMMUNITY): Payer: Self-pay | Admitting: Psychiatry

## 2015-09-12 ENCOUNTER — Ambulatory Visit (INDEPENDENT_AMBULATORY_CARE_PROVIDER_SITE_OTHER): Payer: BLUE CROSS/BLUE SHIELD | Admitting: Psychiatry

## 2015-09-12 DIAGNOSIS — F411 Generalized anxiety disorder: Secondary | ICD-10-CM | POA: Diagnosis not present

## 2015-09-12 NOTE — Progress Notes (Signed)
THERAPIST PROGRESS NOTE  Session Time:  Friday  09/12/2015 1:20 PM -  2:05 PM           Participation Level: Active  Behavioral Response: CasualAlert/ depressed/anxious  Type of Therapy: Individual Therapy  Treatment Goals:    1. Learn and implement behavioral strategies to overcome depression and resume normal interest in activities       2. Process and resolve grief and loss issues related to marital separation identifying and verbalizing feelings       3. Learning implement behavioral strategies to decrease intensity and frequency of anxiety response (ruminating thoughts and panic attacks)       4. Identify and replace negative thoughts that supportive depression and anxiety  Treatment Goals addressed:  3,4  Interventions: Supportive,CBT  Summary: Lewell Berdecia is a 55 y.o. male who is referred for services by psychiatrist Dr. Harrington Challenger due to patient experiencing symptoms of anxiety and depression. Patient reports anxiety for most of his life but says it has increased since his wife left him for the second time  in March 2016. The first time she left was in 2012. They have been married for 26 years. He states wife left because she was unhappy. He reports she also has anxiety and depression too. Patient  blames himself for wife leaving and states she  wanted to go out and do things but patient reports being nervous around crowds throughout his life and preferring to be at home.  Patient reports becoming lightheaded and dizzy when he is in a crowd. He says his father was like this as well and suffered from schizophrenia. Patient reports being paranoid and very cautious. He thinks people are talking about him if he sees people looking at him. He also states always watching out the window at home when he hears loud music as he fears something may happen. Patient reports depression and feeling hopeless.  Patient has not worked since March 2013 when he was crushed under a hydraulic shaft  which caused contusions in pancreas and kidneys. He also says liver was lacerated. He reports financial stress as he received money from a settlement but says it is almost gone. He has appealed an earlier disability denial and has as been informed by his attorney it will be at least 7-8 months before he hears anything.Patient also reports stress related to having Crohn's disease. He was diagnosed in 43. He has had 4 bowel resections. The last surgery was in October 2015.  Patient reports continued stress and excessive worry. He expresses frustration and anxiety  regarding recent conversation with his attorney about disability status as he now has another waiting period to schedule hearing. He also reports stress and anxiety related to estranged wife pressuring him to either buy her out or sell their home. He continues to worry about health and various other issues.  He admits continued negative thinking anticipating the worst using a lot of what if statements. He has started to become more aware of his thought patterns and has been trying to think more positive at times and says his friend has been trying to help him with this.  .  Suicidal/Homicidal: No.  Therapist Response: Therapist works with patient to praise and reinforce patient's efforts to become more aware of his thought patterns as well as his efforts to try to look at the positive, review connection between thoughts, feelings, and mood, identify ways to intervene in negative spiraling thought pattern with thought  stopping technique, introduce anxiety log and discuss instructions,    Plan: Return again in 2 weeks, practice thought stopping technique, complete anxiety log and bring to next session.  Diagnosis: Axis I: Generalized Anxiety Disorder    Axis II: Deferred    Miosha Behe, LCSW 09/12/2015

## 2015-09-12 NOTE — Patient Instructions (Signed)
Discussed orally 

## 2015-09-30 ENCOUNTER — Encounter (HOSPITAL_COMMUNITY): Payer: Self-pay | Admitting: Psychiatry

## 2015-09-30 ENCOUNTER — Ambulatory Visit (INDEPENDENT_AMBULATORY_CARE_PROVIDER_SITE_OTHER): Payer: BLUE CROSS/BLUE SHIELD | Admitting: Psychiatry

## 2015-09-30 DIAGNOSIS — F411 Generalized anxiety disorder: Secondary | ICD-10-CM | POA: Diagnosis not present
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
## 2015-09-30 NOTE — Patient Instructions (Signed)
Discussed orally 

## 2015-10-15 ENCOUNTER — Encounter (HOSPITAL_COMMUNITY): Payer: Self-pay | Admitting: Psychiatry

## 2015-10-15 ENCOUNTER — Ambulatory Visit (INDEPENDENT_AMBULATORY_CARE_PROVIDER_SITE_OTHER): Payer: BLUE CROSS/BLUE SHIELD | Admitting: Psychiatry

## 2015-10-15 VITALS — BP 125/81 | HR 69 | Ht 71.0 in | Wt 174.0 lb

## 2015-10-15 DIAGNOSIS — F411 Generalized anxiety disorder: Secondary | ICD-10-CM

## 2015-10-15 DIAGNOSIS — F4010 Social phobia, unspecified: Secondary | ICD-10-CM

## 2015-10-15 MED ORDER — DULOXETINE HCL 30 MG PO CPEP
30.0000 mg | ORAL_CAPSULE | Freq: Every day | ORAL | Status: DC
Start: 2015-10-15 — End: 2016-01-15

## 2015-10-15 MED ORDER — LORAZEPAM 0.5 MG PO TABS
0.5000 mg | ORAL_TABLET | Freq: Every day | ORAL | Status: DC | PRN
Start: 2015-10-15 — End: 2016-01-15
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
## 2015-11-25 ENCOUNTER — Encounter (HOSPITAL_COMMUNITY): Payer: Self-pay | Admitting: Psychiatry

## 2015-11-25 ENCOUNTER — Ambulatory Visit (INDEPENDENT_AMBULATORY_CARE_PROVIDER_SITE_OTHER): Payer: BLUE CROSS/BLUE SHIELD | Admitting: Psychiatry

## 2015-11-25 DIAGNOSIS — F411 Generalized anxiety disorder: Secondary | ICD-10-CM | POA: Diagnosis not present
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
## 2015-11-25 NOTE — Patient Instructions (Signed)
Discussed orally 

## 2015-12-22 ENCOUNTER — Ambulatory Visit (HOSPITAL_COMMUNITY): Payer: Self-pay | Admitting: Psychiatry

## 2016-01-01 ENCOUNTER — Ambulatory Visit (INDEPENDENT_AMBULATORY_CARE_PROVIDER_SITE_OTHER): Payer: BLUE CROSS/BLUE SHIELD | Admitting: Psychiatry

## 2016-01-01 ENCOUNTER — Encounter (HOSPITAL_COMMUNITY): Payer: Self-pay | Admitting: Psychiatry

## 2016-01-01 DIAGNOSIS — F411 Generalized anxiety disorder: Secondary | ICD-10-CM | POA: Diagnosis not present

## 2016-01-01 NOTE — Patient Instructions (Signed)
Discussed orally 
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
## 2016-01-07 ENCOUNTER — Ambulatory Visit (HOSPITAL_COMMUNITY): Payer: BLUE CROSS/BLUE SHIELD | Admitting: Psychiatry

## 2016-01-15 ENCOUNTER — Ambulatory Visit (INDEPENDENT_AMBULATORY_CARE_PROVIDER_SITE_OTHER): Payer: BLUE CROSS/BLUE SHIELD | Admitting: Psychiatry

## 2016-01-15 ENCOUNTER — Encounter (HOSPITAL_COMMUNITY): Payer: Self-pay | Admitting: Psychiatry

## 2016-01-15 VITALS — BP 143/85 | HR 55 | Ht 71.0 in | Wt 168.8 lb

## 2016-01-15 DIAGNOSIS — F411 Generalized anxiety disorder: Secondary | ICD-10-CM

## 2016-01-15 MED ORDER — DULOXETINE HCL 30 MG PO CPEP
30.0000 mg | ORAL_CAPSULE | Freq: Every day | ORAL | Status: DC
Start: 2016-01-15 — End: 2016-05-18

## 2016-01-15 MED ORDER — LORAZEPAM 0.5 MG PO TABS: 0.5000 mg | ORAL_TABLET | Freq: Every day | ORAL | Status: DC | PRN
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
## 2016-01-26 ENCOUNTER — Ambulatory Visit (HOSPITAL_COMMUNITY): Payer: Self-pay | Admitting: Psychiatry

## 2016-02-18 ENCOUNTER — Encounter (HOSPITAL_COMMUNITY): Payer: Self-pay | Admitting: Psychiatry

## 2016-02-18 ENCOUNTER — Ambulatory Visit (INDEPENDENT_AMBULATORY_CARE_PROVIDER_SITE_OTHER): Payer: BLUE CROSS/BLUE SHIELD | Admitting: Psychiatry

## 2016-02-18 DIAGNOSIS — F411 Generalized anxiety disorder: Secondary | ICD-10-CM | POA: Diagnosis not present
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
## 2016-03-15 ENCOUNTER — Ambulatory Visit (INDEPENDENT_AMBULATORY_CARE_PROVIDER_SITE_OTHER): Payer: BLUE CROSS/BLUE SHIELD | Admitting: Psychiatry

## 2016-03-15 ENCOUNTER — Encounter (HOSPITAL_COMMUNITY): Payer: Self-pay | Admitting: Psychiatry

## 2016-03-15 DIAGNOSIS — F411 Generalized anxiety disorder: Secondary | ICD-10-CM | POA: Diagnosis not present
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
## 2016-04-12 ENCOUNTER — Encounter (HOSPITAL_COMMUNITY): Payer: Self-pay | Admitting: Psychiatry

## 2016-04-12 ENCOUNTER — Ambulatory Visit (INDEPENDENT_AMBULATORY_CARE_PROVIDER_SITE_OTHER): Payer: BLUE CROSS/BLUE SHIELD | Admitting: Psychiatry

## 2016-04-12 DIAGNOSIS — F411 Generalized anxiety disorder: Secondary | ICD-10-CM
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
## 2016-04-15 ENCOUNTER — Encounter (HOSPITAL_COMMUNITY): Payer: Self-pay

## 2016-04-15 ENCOUNTER — Ambulatory Visit (HOSPITAL_COMMUNITY): Payer: Self-pay | Admitting: Psychiatry

## 2016-05-03 ENCOUNTER — Encounter (HOSPITAL_COMMUNITY): Payer: Self-pay | Admitting: Psychiatry

## 2016-05-03 ENCOUNTER — Ambulatory Visit (INDEPENDENT_AMBULATORY_CARE_PROVIDER_SITE_OTHER): Payer: BLUE CROSS/BLUE SHIELD | Admitting: Psychiatry

## 2016-05-03 DIAGNOSIS — F411 Generalized anxiety disorder: Secondary | ICD-10-CM | POA: Diagnosis not present
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
## 2016-05-18 ENCOUNTER — Ambulatory Visit (INDEPENDENT_AMBULATORY_CARE_PROVIDER_SITE_OTHER): Payer: BLUE CROSS/BLUE SHIELD | Admitting: Psychiatry

## 2016-05-18 ENCOUNTER — Encounter (HOSPITAL_COMMUNITY): Payer: Self-pay | Admitting: Psychiatry

## 2016-05-18 VITALS — BP 128/94 | HR 83 | Ht 71.0 in | Wt 174.0 lb

## 2016-05-18 DIAGNOSIS — F411 Generalized anxiety disorder: Secondary | ICD-10-CM

## 2016-05-18 DIAGNOSIS — Z803 Family history of malignant neoplasm of breast: Secondary | ICD-10-CM | POA: Diagnosis not present

## 2016-05-18 DIAGNOSIS — Z8042 Family history of malignant neoplasm of prostate: Secondary | ICD-10-CM

## 2016-05-18 DIAGNOSIS — F4010 Social phobia, unspecified: Secondary | ICD-10-CM | POA: Diagnosis not present
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
## 2016-05-24 ENCOUNTER — Ambulatory Visit (INDEPENDENT_AMBULATORY_CARE_PROVIDER_SITE_OTHER): Payer: BLUE CROSS/BLUE SHIELD | Admitting: Psychiatry

## 2016-05-24 ENCOUNTER — Encounter (HOSPITAL_COMMUNITY): Payer: Self-pay | Admitting: Psychiatry

## 2016-05-24 DIAGNOSIS — F411 Generalized anxiety disorder: Secondary | ICD-10-CM
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
## 2016-06-21 ENCOUNTER — Ambulatory Visit (INDEPENDENT_AMBULATORY_CARE_PROVIDER_SITE_OTHER): Payer: BLUE CROSS/BLUE SHIELD | Admitting: Psychiatry

## 2016-06-21 ENCOUNTER — Encounter (HOSPITAL_COMMUNITY): Payer: Self-pay | Admitting: Psychiatry

## 2016-06-21 DIAGNOSIS — F411 Generalized anxiety disorder: Secondary | ICD-10-CM
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
## 2016-06-27 DIAGNOSIS — Z9049 Acquired absence of other specified parts of digestive tract: Secondary | ICD-10-CM | POA: Diagnosis not present

## 2016-06-27 DIAGNOSIS — K50813 Crohn's disease of both small and large intestine with fistula: Secondary | ICD-10-CM | POA: Diagnosis not present

## 2016-06-27 DIAGNOSIS — K611 Rectal abscess: Secondary | ICD-10-CM | POA: Diagnosis not present

## 2016-06-27 DIAGNOSIS — K50814 Crohn's disease of both small and large intestine with abscess: Secondary | ICD-10-CM | POA: Diagnosis not present

## 2016-06-27 DIAGNOSIS — K612 Anorectal abscess: Secondary | ICD-10-CM | POA: Diagnosis not present

## 2016-06-27 DIAGNOSIS — N189 Chronic kidney disease, unspecified: Secondary | ICD-10-CM | POA: Diagnosis not present

## 2016-06-27 DIAGNOSIS — Z79899 Other long term (current) drug therapy: Secondary | ICD-10-CM | POA: Diagnosis not present

## 2016-06-27 DIAGNOSIS — K6289 Other specified diseases of anus and rectum: Secondary | ICD-10-CM | POA: Diagnosis not present

## 2016-06-27 DIAGNOSIS — Z79891 Long term (current) use of opiate analgesic: Secondary | ICD-10-CM | POA: Diagnosis not present

## 2016-06-27 DIAGNOSIS — F419 Anxiety disorder, unspecified: Secondary | ICD-10-CM | POA: Diagnosis not present

## 2016-06-27 DIAGNOSIS — F329 Major depressive disorder, single episode, unspecified: Secondary | ICD-10-CM | POA: Diagnosis not present

## 2016-06-27 DIAGNOSIS — I129 Hypertensive chronic kidney disease with stage 1 through stage 4 chronic kidney disease, or unspecified chronic kidney disease: Secondary | ICD-10-CM | POA: Diagnosis not present

## 2016-06-27 DIAGNOSIS — R1011 Right upper quadrant pain: Secondary | ICD-10-CM | POA: Diagnosis not present

## 2016-06-27 DIAGNOSIS — E538 Deficiency of other specified B group vitamins: Secondary | ICD-10-CM | POA: Diagnosis not present

## 2016-06-27 DIAGNOSIS — G8929 Other chronic pain: Secondary | ICD-10-CM | POA: Diagnosis not present

## 2016-06-28 DIAGNOSIS — K611 Rectal abscess: Secondary | ICD-10-CM | POA: Diagnosis not present

## 2016-07-08 ENCOUNTER — Ambulatory Visit (INDEPENDENT_AMBULATORY_CARE_PROVIDER_SITE_OTHER): Payer: BLUE CROSS/BLUE SHIELD | Admitting: Psychiatry

## 2016-07-08 ENCOUNTER — Encounter (HOSPITAL_COMMUNITY): Payer: Self-pay | Admitting: Psychiatry

## 2016-07-08 DIAGNOSIS — F411 Generalized anxiety disorder: Secondary | ICD-10-CM
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
## 2016-07-15 DIAGNOSIS — N182 Chronic kidney disease, stage 2 (mild): Secondary | ICD-10-CM | POA: Diagnosis not present

## 2016-07-15 DIAGNOSIS — I10 Essential (primary) hypertension: Secondary | ICD-10-CM | POA: Diagnosis not present

## 2016-07-15 DIAGNOSIS — Z6824 Body mass index (BMI) 24.0-24.9, adult: Secondary | ICD-10-CM | POA: Diagnosis not present

## 2016-07-15 DIAGNOSIS — K5000 Crohn's disease of small intestine without complications: Secondary | ICD-10-CM | POA: Diagnosis not present

## 2016-07-27 DIAGNOSIS — H34812 Central retinal vein occlusion, left eye, with macular edema: Secondary | ICD-10-CM | POA: Diagnosis not present

## 2016-08-12 DIAGNOSIS — Z09 Encounter for follow-up examination after completed treatment for conditions other than malignant neoplasm: Secondary | ICD-10-CM | POA: Diagnosis not present

## 2016-08-12 DIAGNOSIS — Z8719 Personal history of other diseases of the digestive system: Secondary | ICD-10-CM | POA: Diagnosis not present

## 2016-08-12 DIAGNOSIS — K5090 Crohn's disease, unspecified, without complications: Secondary | ICD-10-CM | POA: Diagnosis not present

## 2016-09-03 DIAGNOSIS — H34812 Central retinal vein occlusion, left eye, with macular edema: Secondary | ICD-10-CM | POA: Diagnosis not present

## 2016-09-09 DIAGNOSIS — K610 Anal abscess: Secondary | ICD-10-CM | POA: Diagnosis not present

## 2016-09-09 DIAGNOSIS — K611 Rectal abscess: Secondary | ICD-10-CM | POA: Diagnosis not present

## 2016-09-09 DIAGNOSIS — I10 Essential (primary) hypertension: Secondary | ICD-10-CM | POA: Diagnosis not present

## 2016-09-10 DIAGNOSIS — I10 Essential (primary) hypertension: Secondary | ICD-10-CM | POA: Diagnosis not present

## 2016-09-10 DIAGNOSIS — K610 Anal abscess: Secondary | ICD-10-CM | POA: Diagnosis not present

## 2016-10-05 DIAGNOSIS — Z9229 Personal history of other drug therapy: Secondary | ICD-10-CM | POA: Diagnosis not present

## 2016-10-05 DIAGNOSIS — K610 Anal abscess: Secondary | ICD-10-CM | POA: Diagnosis not present

## 2016-10-05 DIAGNOSIS — Z9889 Other specified postprocedural states: Secondary | ICD-10-CM | POA: Diagnosis not present

## 2016-10-05 DIAGNOSIS — K50013 Crohn's disease of small intestine with fistula: Secondary | ICD-10-CM | POA: Diagnosis not present

## 2016-10-05 DIAGNOSIS — Z79899 Other long term (current) drug therapy: Secondary | ICD-10-CM | POA: Diagnosis not present

## 2016-10-13 DIAGNOSIS — N182 Chronic kidney disease, stage 2 (mild): Secondary | ICD-10-CM | POA: Diagnosis not present

## 2016-10-13 DIAGNOSIS — I10 Essential (primary) hypertension: Secondary | ICD-10-CM | POA: Diagnosis not present

## 2016-10-13 DIAGNOSIS — Z6824 Body mass index (BMI) 24.0-24.9, adult: Secondary | ICD-10-CM | POA: Diagnosis not present

## 2016-10-13 DIAGNOSIS — K5000 Crohn's disease of small intestine without complications: Secondary | ICD-10-CM | POA: Diagnosis not present

## 2016-10-21 DIAGNOSIS — I10 Essential (primary) hypertension: Secondary | ICD-10-CM | POA: Diagnosis not present

## 2016-10-21 DIAGNOSIS — N182 Chronic kidney disease, stage 2 (mild): Secondary | ICD-10-CM | POA: Diagnosis not present

## 2016-10-22 DIAGNOSIS — H35033 Hypertensive retinopathy, bilateral: Secondary | ICD-10-CM | POA: Diagnosis not present

## 2016-10-22 DIAGNOSIS — H34812 Central retinal vein occlusion, left eye, with macular edema: Secondary | ICD-10-CM | POA: Diagnosis not present

## 2016-12-14 DIAGNOSIS — H34812 Central retinal vein occlusion, left eye, with macular edema: Secondary | ICD-10-CM | POA: Diagnosis not present

## 2016-12-15 DIAGNOSIS — M12861 Other specific arthropathies, not elsewhere classified, right knee: Secondary | ICD-10-CM | POA: Diagnosis not present

## 2017-01-11 DIAGNOSIS — I10 Essential (primary) hypertension: Secondary | ICD-10-CM | POA: Diagnosis not present

## 2017-01-11 DIAGNOSIS — K5000 Crohn's disease of small intestine without complications: Secondary | ICD-10-CM | POA: Diagnosis not present

## 2017-01-11 DIAGNOSIS — M25572 Pain in left ankle and joints of left foot: Secondary | ICD-10-CM | POA: Diagnosis not present

## 2017-01-18 DIAGNOSIS — H34812 Central retinal vein occlusion, left eye, with macular edema: Secondary | ICD-10-CM | POA: Diagnosis not present

## 2017-02-08 DIAGNOSIS — R600 Localized edema: Secondary | ICD-10-CM | POA: Diagnosis not present

## 2017-02-08 DIAGNOSIS — I10 Essential (primary) hypertension: Secondary | ICD-10-CM | POA: Diagnosis not present

## 2017-02-09 DIAGNOSIS — H401123 Primary open-angle glaucoma, left eye, severe stage: Secondary | ICD-10-CM | POA: Diagnosis not present

## 2017-02-09 DIAGNOSIS — H40051 Ocular hypertension, right eye: Secondary | ICD-10-CM | POA: Diagnosis not present

## 2017-02-11 DIAGNOSIS — R600 Localized edema: Secondary | ICD-10-CM | POA: Diagnosis not present

## 2017-02-14 DIAGNOSIS — N289 Disorder of kidney and ureter, unspecified: Secondary | ICD-10-CM | POA: Diagnosis not present

## 2017-02-14 DIAGNOSIS — Z0131 Encounter for examination of blood pressure with abnormal findings: Secondary | ICD-10-CM | POA: Diagnosis not present

## 2017-02-14 DIAGNOSIS — R109 Unspecified abdominal pain: Secondary | ICD-10-CM | POA: Diagnosis not present

## 2017-02-14 DIAGNOSIS — M79604 Pain in right leg: Secondary | ICD-10-CM | POA: Diagnosis not present

## 2017-02-14 DIAGNOSIS — S0096XA Insect bite (nonvenomous) of unspecified part of head, initial encounter: Secondary | ICD-10-CM | POA: Diagnosis not present

## 2017-02-14 DIAGNOSIS — D649 Anemia, unspecified: Secondary | ICD-10-CM | POA: Diagnosis not present

## 2017-02-14 DIAGNOSIS — M79671 Pain in right foot: Secondary | ICD-10-CM | POA: Diagnosis not present

## 2017-02-14 DIAGNOSIS — M1000 Idiopathic gout, unspecified site: Secondary | ICD-10-CM | POA: Diagnosis not present

## 2017-02-15 DIAGNOSIS — M79604 Pain in right leg: Secondary | ICD-10-CM | POA: Diagnosis not present

## 2017-02-15 DIAGNOSIS — R600 Localized edema: Secondary | ICD-10-CM | POA: Diagnosis not present

## 2017-02-21 DIAGNOSIS — M7989 Other specified soft tissue disorders: Secondary | ICD-10-CM | POA: Diagnosis not present

## 2017-02-21 DIAGNOSIS — M25571 Pain in right ankle and joints of right foot: Secondary | ICD-10-CM | POA: Diagnosis not present

## 2017-03-03 DIAGNOSIS — H34812 Central retinal vein occlusion, left eye, with macular edema: Secondary | ICD-10-CM | POA: Diagnosis not present

## 2017-03-07 DIAGNOSIS — M79671 Pain in right foot: Secondary | ICD-10-CM | POA: Diagnosis not present

## 2017-03-07 DIAGNOSIS — M7661 Achilles tendinitis, right leg: Secondary | ICD-10-CM | POA: Diagnosis not present

## 2017-03-07 DIAGNOSIS — R600 Localized edema: Secondary | ICD-10-CM | POA: Diagnosis not present

## 2017-03-07 DIAGNOSIS — M1990 Unspecified osteoarthritis, unspecified site: Secondary | ICD-10-CM | POA: Diagnosis not present

## 2017-03-17 DIAGNOSIS — K5000 Crohn's disease of small intestine without complications: Secondary | ICD-10-CM | POA: Diagnosis not present

## 2017-03-17 DIAGNOSIS — K50013 Crohn's disease of small intestine with fistula: Secondary | ICD-10-CM | POA: Diagnosis not present

## 2017-03-17 DIAGNOSIS — Z9889 Other specified postprocedural states: Secondary | ICD-10-CM | POA: Diagnosis not present

## 2017-03-17 DIAGNOSIS — M109 Gout, unspecified: Secondary | ICD-10-CM | POA: Diagnosis not present

## 2017-03-17 DIAGNOSIS — E538 Deficiency of other specified B group vitamins: Secondary | ICD-10-CM | POA: Diagnosis not present

## 2017-04-06 DIAGNOSIS — H401123 Primary open-angle glaucoma, left eye, severe stage: Secondary | ICD-10-CM | POA: Diagnosis not present

## 2017-04-08 DIAGNOSIS — K5090 Crohn's disease, unspecified, without complications: Secondary | ICD-10-CM | POA: Diagnosis not present

## 2017-04-08 DIAGNOSIS — R509 Fever, unspecified: Secondary | ICD-10-CM | POA: Diagnosis not present

## 2017-04-08 DIAGNOSIS — R1011 Right upper quadrant pain: Secondary | ICD-10-CM | POA: Diagnosis not present

## 2017-04-08 DIAGNOSIS — R197 Diarrhea, unspecified: Secondary | ICD-10-CM | POA: Diagnosis not present

## 2017-04-08 DIAGNOSIS — R109 Unspecified abdominal pain: Secondary | ICD-10-CM | POA: Diagnosis not present

## 2017-04-20 DIAGNOSIS — H35033 Hypertensive retinopathy, bilateral: Secondary | ICD-10-CM | POA: Diagnosis not present

## 2017-04-20 DIAGNOSIS — H34812 Central retinal vein occlusion, left eye, with macular edema: Secondary | ICD-10-CM | POA: Diagnosis not present

## 2017-04-23 DIAGNOSIS — R600 Localized edema: Secondary | ICD-10-CM | POA: Diagnosis not present

## 2017-04-23 DIAGNOSIS — M79604 Pain in right leg: Secondary | ICD-10-CM | POA: Diagnosis not present

## 2017-04-23 DIAGNOSIS — M25571 Pain in right ankle and joints of right foot: Secondary | ICD-10-CM | POA: Diagnosis not present

## 2017-04-23 DIAGNOSIS — M25471 Effusion, right ankle: Secondary | ICD-10-CM | POA: Diagnosis not present

## 2017-04-23 DIAGNOSIS — Y33XXXA Other specified events, undetermined intent, initial encounter: Secondary | ICD-10-CM | POA: Diagnosis not present

## 2017-04-23 DIAGNOSIS — R109 Unspecified abdominal pain: Secondary | ICD-10-CM | POA: Diagnosis not present

## 2017-04-23 DIAGNOSIS — K529 Noninfective gastroenteritis and colitis, unspecified: Secondary | ICD-10-CM | POA: Diagnosis not present

## 2017-04-23 DIAGNOSIS — K5090 Crohn's disease, unspecified, without complications: Secondary | ICD-10-CM | POA: Diagnosis not present

## 2017-04-23 DIAGNOSIS — Z5181 Encounter for therapeutic drug level monitoring: Secondary | ICD-10-CM | POA: Diagnosis not present

## 2017-04-23 DIAGNOSIS — R1011 Right upper quadrant pain: Secondary | ICD-10-CM | POA: Diagnosis not present

## 2017-04-23 DIAGNOSIS — R1031 Right lower quadrant pain: Secondary | ICD-10-CM | POA: Diagnosis not present

## 2017-04-23 DIAGNOSIS — M79674 Pain in right toe(s): Secondary | ICD-10-CM | POA: Diagnosis not present

## 2017-04-23 DIAGNOSIS — S99921A Unspecified injury of right foot, initial encounter: Secondary | ICD-10-CM | POA: Diagnosis not present

## 2017-04-23 DIAGNOSIS — Z9049 Acquired absence of other specified parts of digestive tract: Secondary | ICD-10-CM | POA: Diagnosis not present

## 2017-05-03 DIAGNOSIS — K611 Rectal abscess: Secondary | ICD-10-CM | POA: Diagnosis not present

## 2017-05-03 DIAGNOSIS — Z79899 Other long term (current) drug therapy: Secondary | ICD-10-CM | POA: Diagnosis not present

## 2017-05-03 DIAGNOSIS — K5090 Crohn's disease, unspecified, without complications: Secondary | ICD-10-CM | POA: Diagnosis not present

## 2017-05-03 DIAGNOSIS — K6289 Other specified diseases of anus and rectum: Secondary | ICD-10-CM | POA: Diagnosis not present

## 2017-05-04 DIAGNOSIS — I10 Essential (primary) hypertension: Secondary | ICD-10-CM | POA: Diagnosis not present

## 2017-05-04 DIAGNOSIS — K5090 Crohn's disease, unspecified, without complications: Secondary | ICD-10-CM | POA: Diagnosis not present

## 2017-05-04 DIAGNOSIS — K610 Anal abscess: Secondary | ICD-10-CM | POA: Diagnosis not present

## 2017-05-04 DIAGNOSIS — K611 Rectal abscess: Secondary | ICD-10-CM | POA: Diagnosis not present

## 2017-05-05 DIAGNOSIS — K5090 Crohn's disease, unspecified, without complications: Secondary | ICD-10-CM | POA: Diagnosis not present

## 2017-05-05 DIAGNOSIS — K610 Anal abscess: Secondary | ICD-10-CM | POA: Diagnosis not present

## 2017-05-19 DIAGNOSIS — K611 Rectal abscess: Secondary | ICD-10-CM | POA: Diagnosis not present

## 2017-05-19 DIAGNOSIS — K5010 Crohn's disease of large intestine without complications: Secondary | ICD-10-CM | POA: Diagnosis not present

## 2017-05-20 DIAGNOSIS — K50013 Crohn's disease of small intestine with fistula: Secondary | ICD-10-CM | POA: Diagnosis not present

## 2017-06-03 DIAGNOSIS — H34812 Central retinal vein occlusion, left eye, with macular edema: Secondary | ICD-10-CM | POA: Diagnosis not present

## 2017-07-21 DIAGNOSIS — E538 Deficiency of other specified B group vitamins: Secondary | ICD-10-CM | POA: Diagnosis not present

## 2017-07-21 DIAGNOSIS — K50013 Crohn's disease of small intestine with fistula: Secondary | ICD-10-CM | POA: Diagnosis not present

## 2017-07-21 DIAGNOSIS — I10 Essential (primary) hypertension: Secondary | ICD-10-CM | POA: Diagnosis not present

## 2017-07-21 DIAGNOSIS — M109 Gout, unspecified: Secondary | ICD-10-CM | POA: Diagnosis not present

## 2017-07-29 DIAGNOSIS — H34812 Central retinal vein occlusion, left eye, with macular edema: Secondary | ICD-10-CM | POA: Diagnosis not present

## 2017-08-03 DIAGNOSIS — H40051 Ocular hypertension, right eye: Secondary | ICD-10-CM | POA: Diagnosis not present

## 2017-08-03 DIAGNOSIS — H401123 Primary open-angle glaucoma, left eye, severe stage: Secondary | ICD-10-CM | POA: Diagnosis not present

## 2017-08-11 DIAGNOSIS — Z0131 Encounter for examination of blood pressure with abnormal findings: Secondary | ICD-10-CM | POA: Diagnosis not present

## 2017-08-11 DIAGNOSIS — N419 Inflammatory disease of prostate, unspecified: Secondary | ICD-10-CM | POA: Diagnosis not present

## 2017-08-11 DIAGNOSIS — N390 Urinary tract infection, site not specified: Secondary | ICD-10-CM | POA: Diagnosis not present

## 2017-08-11 DIAGNOSIS — R300 Dysuria: Secondary | ICD-10-CM | POA: Diagnosis not present

## 2017-08-24 DIAGNOSIS — N4889 Other specified disorders of penis: Secondary | ICD-10-CM | POA: Diagnosis not present

## 2017-08-24 DIAGNOSIS — K5090 Crohn's disease, unspecified, without complications: Secondary | ICD-10-CM | POA: Diagnosis not present

## 2017-09-01 DIAGNOSIS — J328 Other chronic sinusitis: Secondary | ICD-10-CM | POA: Diagnosis not present

## 2017-09-01 DIAGNOSIS — Z0000 Encounter for general adult medical examination without abnormal findings: Secondary | ICD-10-CM | POA: Diagnosis not present

## 2017-09-01 DIAGNOSIS — I10 Essential (primary) hypertension: Secondary | ICD-10-CM | POA: Diagnosis not present

## 2017-09-08 DIAGNOSIS — N400 Enlarged prostate without lower urinary tract symptoms: Secondary | ICD-10-CM | POA: Diagnosis not present

## 2017-09-08 DIAGNOSIS — R3914 Feeling of incomplete bladder emptying: Secondary | ICD-10-CM | POA: Diagnosis not present

## 2017-09-15 DIAGNOSIS — H34812 Central retinal vein occlusion, left eye, with macular edema: Secondary | ICD-10-CM | POA: Diagnosis not present

## 2017-10-21 DIAGNOSIS — H34812 Central retinal vein occlusion, left eye, with macular edema: Secondary | ICD-10-CM | POA: Diagnosis not present

## 2017-11-03 DIAGNOSIS — H401123 Primary open-angle glaucoma, left eye, severe stage: Secondary | ICD-10-CM | POA: Diagnosis not present

## 2017-11-18 DIAGNOSIS — H34812 Central retinal vein occlusion, left eye, with macular edema: Secondary | ICD-10-CM | POA: Diagnosis not present

## 2017-12-01 DIAGNOSIS — I10 Essential (primary) hypertension: Secondary | ICD-10-CM | POA: Diagnosis not present

## 2017-12-05 DIAGNOSIS — K5090 Crohn's disease, unspecified, without complications: Secondary | ICD-10-CM | POA: Diagnosis not present

## 2017-12-05 DIAGNOSIS — N400 Enlarged prostate without lower urinary tract symptoms: Secondary | ICD-10-CM | POA: Diagnosis not present

## 2017-12-21 DIAGNOSIS — H401123 Primary open-angle glaucoma, left eye, severe stage: Secondary | ICD-10-CM | POA: Diagnosis not present

## 2018-01-09 DIAGNOSIS — H34812 Central retinal vein occlusion, left eye, with macular edema: Secondary | ICD-10-CM | POA: Diagnosis not present

## 2018-02-06 DIAGNOSIS — H35033 Hypertensive retinopathy, bilateral: Secondary | ICD-10-CM | POA: Diagnosis not present

## 2018-02-06 DIAGNOSIS — H34812 Central retinal vein occlusion, left eye, with macular edema: Secondary | ICD-10-CM | POA: Diagnosis not present

## 2018-02-23 DIAGNOSIS — K50013 Crohn's disease of small intestine with fistula: Secondary | ICD-10-CM | POA: Diagnosis not present

## 2018-02-23 DIAGNOSIS — E538 Deficiency of other specified B group vitamins: Secondary | ICD-10-CM | POA: Diagnosis not present

## 2018-03-02 DIAGNOSIS — J3089 Other allergic rhinitis: Secondary | ICD-10-CM | POA: Diagnosis not present

## 2018-03-02 DIAGNOSIS — I10 Essential (primary) hypertension: Secondary | ICD-10-CM | POA: Diagnosis not present

## 2018-03-13 DIAGNOSIS — H34812 Central retinal vein occlusion, left eye, with macular edema: Secondary | ICD-10-CM | POA: Diagnosis not present

## 2018-04-12 DIAGNOSIS — H401123 Primary open-angle glaucoma, left eye, severe stage: Secondary | ICD-10-CM | POA: Diagnosis not present

## 2018-04-18 DIAGNOSIS — Z79899 Other long term (current) drug therapy: Secondary | ICD-10-CM | POA: Diagnosis not present

## 2018-04-18 DIAGNOSIS — R1310 Dysphagia, unspecified: Secondary | ICD-10-CM | POA: Diagnosis not present

## 2018-04-18 DIAGNOSIS — I10 Essential (primary) hypertension: Secondary | ICD-10-CM | POA: Diagnosis not present

## 2018-04-18 DIAGNOSIS — K50013 Crohn's disease of small intestine with fistula: Secondary | ICD-10-CM | POA: Diagnosis not present

## 2018-04-27 DIAGNOSIS — H35033 Hypertensive retinopathy, bilateral: Secondary | ICD-10-CM | POA: Diagnosis not present

## 2018-04-27 DIAGNOSIS — H34812 Central retinal vein occlusion, left eye, with macular edema: Secondary | ICD-10-CM | POA: Diagnosis not present

## 2018-06-05 DIAGNOSIS — I1311 Hypertensive heart and chronic kidney disease without heart failure, with stage 5 chronic kidney disease, or end stage renal disease: Secondary | ICD-10-CM | POA: Diagnosis not present

## 2018-06-05 DIAGNOSIS — J3089 Other allergic rhinitis: Secondary | ICD-10-CM | POA: Diagnosis not present

## 2018-07-24 DIAGNOSIS — H34812 Central retinal vein occlusion, left eye, with macular edema: Secondary | ICD-10-CM | POA: Diagnosis not present

## 2018-08-17 DIAGNOSIS — E538 Deficiency of other specified B group vitamins: Secondary | ICD-10-CM | POA: Diagnosis not present

## 2018-08-17 DIAGNOSIS — K5090 Crohn's disease, unspecified, without complications: Secondary | ICD-10-CM | POA: Diagnosis not present

## 2018-08-17 DIAGNOSIS — K6289 Other specified diseases of anus and rectum: Secondary | ICD-10-CM | POA: Diagnosis not present

## 2018-08-17 DIAGNOSIS — K50013 Crohn's disease of small intestine with fistula: Secondary | ICD-10-CM | POA: Diagnosis not present

## 2018-08-17 DIAGNOSIS — K603 Anal fistula: Secondary | ICD-10-CM | POA: Diagnosis not present

## 2018-08-30 DIAGNOSIS — H40051 Ocular hypertension, right eye: Secondary | ICD-10-CM | POA: Diagnosis not present

## 2018-08-30 DIAGNOSIS — H401123 Primary open-angle glaucoma, left eye, severe stage: Secondary | ICD-10-CM | POA: Diagnosis not present

## 2018-09-04 DIAGNOSIS — N184 Chronic kidney disease, stage 4 (severe): Secondary | ICD-10-CM | POA: Diagnosis not present

## 2018-09-04 DIAGNOSIS — I10 Essential (primary) hypertension: Secondary | ICD-10-CM | POA: Diagnosis not present

## 2018-09-04 DIAGNOSIS — Z0000 Encounter for general adult medical examination without abnormal findings: Secondary | ICD-10-CM | POA: Diagnosis not present

## 2018-09-04 DIAGNOSIS — Z1389 Encounter for screening for other disorder: Secondary | ICD-10-CM | POA: Diagnosis not present

## 2018-09-08 DIAGNOSIS — H34812 Central retinal vein occlusion, left eye, with macular edema: Secondary | ICD-10-CM | POA: Diagnosis not present

## 2018-09-11 DIAGNOSIS — R102 Pelvic and perineal pain: Secondary | ICD-10-CM | POA: Diagnosis not present

## 2018-09-11 DIAGNOSIS — N400 Enlarged prostate without lower urinary tract symptoms: Secondary | ICD-10-CM | POA: Diagnosis not present

## 2018-09-11 DIAGNOSIS — K5090 Crohn's disease, unspecified, without complications: Secondary | ICD-10-CM | POA: Diagnosis not present

## 2018-09-11 DIAGNOSIS — Z8719 Personal history of other diseases of the digestive system: Secondary | ICD-10-CM | POA: Diagnosis not present

## 2018-10-16 DIAGNOSIS — H35033 Hypertensive retinopathy, bilateral: Secondary | ICD-10-CM | POA: Diagnosis not present

## 2018-10-16 DIAGNOSIS — H34812 Central retinal vein occlusion, left eye, with macular edema: Secondary | ICD-10-CM | POA: Diagnosis not present

## 2018-12-01 DIAGNOSIS — H34812 Central retinal vein occlusion, left eye, with macular edema: Secondary | ICD-10-CM | POA: Diagnosis not present

## 2018-12-04 DIAGNOSIS — J309 Allergic rhinitis, unspecified: Secondary | ICD-10-CM | POA: Diagnosis not present

## 2018-12-04 DIAGNOSIS — I10 Essential (primary) hypertension: Secondary | ICD-10-CM | POA: Diagnosis not present

## 2018-12-04 DIAGNOSIS — N184 Chronic kidney disease, stage 4 (severe): Secondary | ICD-10-CM | POA: Diagnosis not present

## 2018-12-19 DIAGNOSIS — K603 Anal fistula: Secondary | ICD-10-CM | POA: Diagnosis not present

## 2018-12-19 DIAGNOSIS — K50013 Crohn's disease of small intestine with fistula: Secondary | ICD-10-CM | POA: Diagnosis not present

## 2018-12-19 DIAGNOSIS — Z79899 Other long term (current) drug therapy: Secondary | ICD-10-CM | POA: Diagnosis not present

## 2018-12-19 DIAGNOSIS — R1310 Dysphagia, unspecified: Secondary | ICD-10-CM | POA: Diagnosis not present

## 2019-01-03 DIAGNOSIS — K50013 Crohn's disease of small intestine with fistula: Secondary | ICD-10-CM | POA: Diagnosis not present

## 2019-01-09 DIAGNOSIS — H34812 Central retinal vein occlusion, left eye, with macular edema: Secondary | ICD-10-CM | POA: Diagnosis not present

## 2019-01-25 DIAGNOSIS — H401123 Primary open-angle glaucoma, left eye, severe stage: Secondary | ICD-10-CM | POA: Diagnosis not present

## 2019-02-20 DIAGNOSIS — K611 Rectal abscess: Secondary | ICD-10-CM | POA: Diagnosis not present

## 2019-02-20 DIAGNOSIS — Z79899 Other long term (current) drug therapy: Secondary | ICD-10-CM | POA: Diagnosis not present

## 2019-03-06 DIAGNOSIS — H34812 Central retinal vein occlusion, left eye, with macular edema: Secondary | ICD-10-CM | POA: Diagnosis not present

## 2019-03-12 DIAGNOSIS — N184 Chronic kidney disease, stage 4 (severe): Secondary | ICD-10-CM | POA: Diagnosis not present

## 2019-03-12 DIAGNOSIS — J309 Allergic rhinitis, unspecified: Secondary | ICD-10-CM | POA: Diagnosis not present

## 2019-03-12 DIAGNOSIS — I10 Essential (primary) hypertension: Secondary | ICD-10-CM | POA: Diagnosis not present

## 2019-03-15 DIAGNOSIS — Z48817 Encounter for surgical aftercare following surgery on the skin and subcutaneous tissue: Secondary | ICD-10-CM | POA: Diagnosis not present

## 2019-03-15 DIAGNOSIS — K611 Rectal abscess: Secondary | ICD-10-CM | POA: Diagnosis not present

## 2019-03-15 DIAGNOSIS — K5090 Crohn's disease, unspecified, without complications: Secondary | ICD-10-CM | POA: Diagnosis not present

## 2019-04-20 DIAGNOSIS — N184 Chronic kidney disease, stage 4 (severe): Secondary | ICD-10-CM | POA: Diagnosis not present

## 2019-04-20 DIAGNOSIS — I10 Essential (primary) hypertension: Secondary | ICD-10-CM | POA: Diagnosis not present

## 2019-04-30 DIAGNOSIS — I10 Essential (primary) hypertension: Secondary | ICD-10-CM | POA: Diagnosis not present

## 2019-04-30 DIAGNOSIS — F5221 Male erectile disorder: Secondary | ICD-10-CM | POA: Diagnosis not present

## 2019-04-30 DIAGNOSIS — R1311 Dysphagia, oral phase: Secondary | ICD-10-CM | POA: Diagnosis not present

## 2019-04-30 DIAGNOSIS — N184 Chronic kidney disease, stage 4 (severe): Secondary | ICD-10-CM | POA: Diagnosis not present

## 2019-05-14 DIAGNOSIS — H34812 Central retinal vein occlusion, left eye, with macular edema: Secondary | ICD-10-CM | POA: Diagnosis not present

## 2019-05-14 DIAGNOSIS — H35033 Hypertensive retinopathy, bilateral: Secondary | ICD-10-CM | POA: Diagnosis not present

## 2019-05-30 DIAGNOSIS — H401123 Primary open-angle glaucoma, left eye, severe stage: Secondary | ICD-10-CM | POA: Diagnosis not present

## 2019-06-07 DIAGNOSIS — K50013 Crohn's disease of small intestine with fistula: Secondary | ICD-10-CM | POA: Diagnosis not present

## 2019-06-07 DIAGNOSIS — K5090 Crohn's disease, unspecified, without complications: Secondary | ICD-10-CM | POA: Diagnosis not present

## 2019-06-07 DIAGNOSIS — E538 Deficiency of other specified B group vitamins: Secondary | ICD-10-CM | POA: Diagnosis not present

## 2019-06-08 DIAGNOSIS — N184 Chronic kidney disease, stage 4 (severe): Secondary | ICD-10-CM | POA: Diagnosis not present

## 2019-06-26 DIAGNOSIS — H34812 Central retinal vein occlusion, left eye, with macular edema: Secondary | ICD-10-CM | POA: Diagnosis not present

## 2019-08-09 DIAGNOSIS — H401123 Primary open-angle glaucoma, left eye, severe stage: Secondary | ICD-10-CM | POA: Diagnosis not present

## 2019-08-16 DIAGNOSIS — H34812 Central retinal vein occlusion, left eye, with macular edema: Secondary | ICD-10-CM | POA: Diagnosis not present

## 2019-08-20 DIAGNOSIS — N184 Chronic kidney disease, stage 4 (severe): Secondary | ICD-10-CM | POA: Diagnosis not present

## 2019-08-20 DIAGNOSIS — I10 Essential (primary) hypertension: Secondary | ICD-10-CM | POA: Diagnosis not present

## 2019-08-20 DIAGNOSIS — F5221 Male erectile disorder: Secondary | ICD-10-CM | POA: Diagnosis not present

## 2019-10-04 DIAGNOSIS — Z9889 Other specified postprocedural states: Secondary | ICD-10-CM | POA: Diagnosis not present

## 2019-10-04 DIAGNOSIS — Z79899 Other long term (current) drug therapy: Secondary | ICD-10-CM | POA: Diagnosis not present

## 2019-10-04 DIAGNOSIS — K50013 Crohn's disease of small intestine with fistula: Secondary | ICD-10-CM | POA: Diagnosis not present

## 2019-10-04 DIAGNOSIS — E538 Deficiency of other specified B group vitamins: Secondary | ICD-10-CM | POA: Diagnosis not present

## 2019-10-08 DIAGNOSIS — H34812 Central retinal vein occlusion, left eye, with macular edema: Secondary | ICD-10-CM | POA: Diagnosis not present

## 2019-10-08 DIAGNOSIS — H35033 Hypertensive retinopathy, bilateral: Secondary | ICD-10-CM | POA: Diagnosis not present

## 2019-11-26 DIAGNOSIS — I10 Essential (primary) hypertension: Secondary | ICD-10-CM | POA: Diagnosis not present

## 2019-11-26 DIAGNOSIS — F5221 Male erectile disorder: Secondary | ICD-10-CM | POA: Diagnosis not present

## 2019-11-26 DIAGNOSIS — L0212 Furuncle of neck: Secondary | ICD-10-CM | POA: Diagnosis not present

## 2019-11-26 DIAGNOSIS — N184 Chronic kidney disease, stage 4 (severe): Secondary | ICD-10-CM | POA: Diagnosis not present

## 2019-11-28 DIAGNOSIS — H401123 Primary open-angle glaucoma, left eye, severe stage: Secondary | ICD-10-CM | POA: Diagnosis not present

## 2019-12-06 DIAGNOSIS — H34812 Central retinal vein occlusion, left eye, with macular edema: Secondary | ICD-10-CM | POA: Diagnosis not present

## 2020-02-01 DIAGNOSIS — H34812 Central retinal vein occlusion, left eye, with macular edema: Secondary | ICD-10-CM | POA: Diagnosis not present

## 2020-02-06 DIAGNOSIS — H35033 Hypertensive retinopathy, bilateral: Secondary | ICD-10-CM | POA: Diagnosis not present

## 2020-02-06 DIAGNOSIS — H401123 Primary open-angle glaucoma, left eye, severe stage: Secondary | ICD-10-CM | POA: Diagnosis not present

## 2020-02-06 DIAGNOSIS — H34812 Central retinal vein occlusion, left eye, with macular edema: Secondary | ICD-10-CM | POA: Diagnosis not present

## 2020-02-06 DIAGNOSIS — H40051 Ocular hypertension, right eye: Secondary | ICD-10-CM | POA: Diagnosis not present

## 2020-03-17 DIAGNOSIS — N184 Chronic kidney disease, stage 4 (severe): Secondary | ICD-10-CM | POA: Diagnosis not present

## 2020-03-17 DIAGNOSIS — Z1331 Encounter for screening for depression: Secondary | ICD-10-CM | POA: Diagnosis not present

## 2020-03-17 DIAGNOSIS — Z79899 Other long term (current) drug therapy: Secondary | ICD-10-CM | POA: Diagnosis not present

## 2020-03-17 DIAGNOSIS — Z0000 Encounter for general adult medical examination without abnormal findings: Secondary | ICD-10-CM | POA: Diagnosis not present

## 2020-03-17 DIAGNOSIS — I10 Essential (primary) hypertension: Secondary | ICD-10-CM | POA: Diagnosis not present

## 2020-03-17 DIAGNOSIS — R310 Gross hematuria: Secondary | ICD-10-CM | POA: Diagnosis not present

## 2020-03-17 DIAGNOSIS — F5221 Male erectile disorder: Secondary | ICD-10-CM | POA: Diagnosis not present

## 2020-03-17 DIAGNOSIS — L0212 Furuncle of neck: Secondary | ICD-10-CM | POA: Diagnosis not present

## 2020-03-17 DIAGNOSIS — Z125 Encounter for screening for malignant neoplasm of prostate: Secondary | ICD-10-CM | POA: Diagnosis not present

## 2020-04-01 DIAGNOSIS — Z9889 Other specified postprocedural states: Secondary | ICD-10-CM | POA: Diagnosis not present

## 2020-04-01 DIAGNOSIS — K50013 Crohn's disease of small intestine with fistula: Secondary | ICD-10-CM | POA: Diagnosis not present

## 2020-04-01 DIAGNOSIS — E538 Deficiency of other specified B group vitamins: Secondary | ICD-10-CM | POA: Diagnosis not present

## 2020-04-01 DIAGNOSIS — Z79899 Other long term (current) drug therapy: Secondary | ICD-10-CM | POA: Diagnosis not present

## 2020-04-08 DIAGNOSIS — H34812 Central retinal vein occlusion, left eye, with macular edema: Secondary | ICD-10-CM | POA: Diagnosis not present

## 2020-04-08 DIAGNOSIS — H35033 Hypertensive retinopathy, bilateral: Secondary | ICD-10-CM | POA: Diagnosis not present

## 2020-04-14 DIAGNOSIS — K5090 Crohn's disease, unspecified, without complications: Secondary | ICD-10-CM | POA: Diagnosis not present

## 2020-04-14 DIAGNOSIS — R7612 Nonspecific reaction to cell mediated immunity measurement of gamma interferon antigen response without active tuberculosis: Secondary | ICD-10-CM | POA: Diagnosis not present

## 2020-04-14 DIAGNOSIS — Z139 Encounter for screening, unspecified: Secondary | ICD-10-CM | POA: Diagnosis not present

## 2020-04-14 DIAGNOSIS — Z111 Encounter for screening for respiratory tuberculosis: Secondary | ICD-10-CM | POA: Diagnosis not present

## 2020-06-04 DIAGNOSIS — K5090 Crohn's disease, unspecified, without complications: Secondary | ICD-10-CM | POA: Diagnosis not present

## 2020-06-04 DIAGNOSIS — K50013 Crohn's disease of small intestine with fistula: Secondary | ICD-10-CM | POA: Diagnosis not present

## 2020-06-04 DIAGNOSIS — K5080 Crohn's disease of both small and large intestine without complications: Secondary | ICD-10-CM | POA: Diagnosis not present

## 2020-06-05 DIAGNOSIS — H34812 Central retinal vein occlusion, left eye, with macular edema: Secondary | ICD-10-CM | POA: Diagnosis not present

## 2020-06-11 DIAGNOSIS — Z23 Encounter for immunization: Secondary | ICD-10-CM | POA: Diagnosis not present

## 2020-06-24 DIAGNOSIS — L0212 Furuncle of neck: Secondary | ICD-10-CM | POA: Diagnosis not present

## 2020-06-24 DIAGNOSIS — I10 Essential (primary) hypertension: Secondary | ICD-10-CM | POA: Diagnosis not present

## 2020-06-24 DIAGNOSIS — F5221 Male erectile disorder: Secondary | ICD-10-CM | POA: Diagnosis not present

## 2020-06-24 DIAGNOSIS — R310 Gross hematuria: Secondary | ICD-10-CM | POA: Diagnosis not present

## 2020-06-24 DIAGNOSIS — N184 Chronic kidney disease, stage 4 (severe): Secondary | ICD-10-CM | POA: Diagnosis not present

## 2020-06-25 DIAGNOSIS — H401123 Primary open-angle glaucoma, left eye, severe stage: Secondary | ICD-10-CM | POA: Diagnosis not present

## 2020-07-24 DIAGNOSIS — H34812 Central retinal vein occlusion, left eye, with macular edema: Secondary | ICD-10-CM | POA: Diagnosis not present

## 2020-09-19 DIAGNOSIS — H35033 Hypertensive retinopathy, bilateral: Secondary | ICD-10-CM | POA: Diagnosis not present

## 2020-09-19 DIAGNOSIS — H34812 Central retinal vein occlusion, left eye, with macular edema: Secondary | ICD-10-CM | POA: Diagnosis not present

## 2020-09-24 DIAGNOSIS — F5221 Male erectile disorder: Secondary | ICD-10-CM | POA: Diagnosis not present

## 2020-09-24 DIAGNOSIS — I10 Essential (primary) hypertension: Secondary | ICD-10-CM | POA: Diagnosis not present

## 2020-09-24 DIAGNOSIS — L0212 Furuncle of neck: Secondary | ICD-10-CM | POA: Diagnosis not present

## 2020-09-24 DIAGNOSIS — R310 Gross hematuria: Secondary | ICD-10-CM | POA: Diagnosis not present

## 2020-09-24 DIAGNOSIS — K50813 Crohn's disease of both small and large intestine with fistula: Secondary | ICD-10-CM | POA: Diagnosis not present

## 2020-09-24 DIAGNOSIS — N184 Chronic kidney disease, stage 4 (severe): Secondary | ICD-10-CM | POA: Diagnosis not present

## 2020-10-01 DIAGNOSIS — H401123 Primary open-angle glaucoma, left eye, severe stage: Secondary | ICD-10-CM | POA: Diagnosis not present

## 2020-10-30 DIAGNOSIS — K50914 Crohn's disease, unspecified, with abscess: Secondary | ICD-10-CM | POA: Diagnosis not present

## 2020-10-30 DIAGNOSIS — K50913 Crohn's disease, unspecified, with fistula: Secondary | ICD-10-CM | POA: Diagnosis not present

## 2020-10-30 DIAGNOSIS — Z9049 Acquired absence of other specified parts of digestive tract: Secondary | ICD-10-CM | POA: Diagnosis not present

## 2020-10-30 DIAGNOSIS — K50013 Crohn's disease of small intestine with fistula: Secondary | ICD-10-CM | POA: Diagnosis not present

## 2020-10-30 DIAGNOSIS — R1011 Right upper quadrant pain: Secondary | ICD-10-CM | POA: Diagnosis not present

## 2020-10-30 DIAGNOSIS — R197 Diarrhea, unspecified: Secondary | ICD-10-CM | POA: Diagnosis not present

## 2020-12-15 DIAGNOSIS — M542 Cervicalgia: Secondary | ICD-10-CM | POA: Diagnosis not present

## 2020-12-15 DIAGNOSIS — M25512 Pain in left shoulder: Secondary | ICD-10-CM | POA: Diagnosis not present

## 2020-12-15 DIAGNOSIS — R7989 Other specified abnormal findings of blood chemistry: Secondary | ICD-10-CM | POA: Diagnosis not present

## 2020-12-15 DIAGNOSIS — R079 Chest pain, unspecified: Secondary | ICD-10-CM | POA: Diagnosis not present

## 2020-12-15 DIAGNOSIS — M79602 Pain in left arm: Secondary | ICD-10-CM | POA: Diagnosis not present

## 2020-12-16 DIAGNOSIS — S46912A Strain of unspecified muscle, fascia and tendon at shoulder and upper arm level, left arm, initial encounter: Secondary | ICD-10-CM | POA: Diagnosis not present

## 2020-12-16 DIAGNOSIS — S4992XA Unspecified injury of left shoulder and upper arm, initial encounter: Secondary | ICD-10-CM | POA: Diagnosis not present

## 2020-12-16 DIAGNOSIS — X58XXXA Exposure to other specified factors, initial encounter: Secondary | ICD-10-CM | POA: Diagnosis not present

## 2020-12-16 DIAGNOSIS — M25512 Pain in left shoulder: Secondary | ICD-10-CM | POA: Diagnosis not present

## 2020-12-17 DIAGNOSIS — M542 Cervicalgia: Secondary | ICD-10-CM | POA: Diagnosis not present

## 2020-12-18 DIAGNOSIS — M1990 Unspecified osteoarthritis, unspecified site: Secondary | ICD-10-CM | POA: Diagnosis not present

## 2020-12-18 DIAGNOSIS — N183 Chronic kidney disease, stage 3 unspecified: Secondary | ICD-10-CM | POA: Diagnosis not present

## 2020-12-18 DIAGNOSIS — E538 Deficiency of other specified B group vitamins: Secondary | ICD-10-CM | POA: Diagnosis not present

## 2020-12-18 DIAGNOSIS — I10 Essential (primary) hypertension: Secondary | ICD-10-CM | POA: Diagnosis not present

## 2020-12-18 DIAGNOSIS — K5090 Crohn's disease, unspecified, without complications: Secondary | ICD-10-CM | POA: Diagnosis not present

## 2020-12-18 DIAGNOSIS — Z7689 Persons encountering health services in other specified circumstances: Secondary | ICD-10-CM | POA: Diagnosis not present

## 2020-12-18 DIAGNOSIS — R9431 Abnormal electrocardiogram [ECG] [EKG]: Secondary | ICD-10-CM | POA: Diagnosis not present

## 2020-12-25 DIAGNOSIS — S43492A Other sprain of left shoulder joint, initial encounter: Secondary | ICD-10-CM | POA: Diagnosis not present

## 2021-01-08 DIAGNOSIS — I10 Essential (primary) hypertension: Secondary | ICD-10-CM | POA: Diagnosis not present

## 2021-01-08 DIAGNOSIS — S43492A Other sprain of left shoulder joint, initial encounter: Secondary | ICD-10-CM | POA: Diagnosis not present

## 2021-01-15 DIAGNOSIS — H35033 Hypertensive retinopathy, bilateral: Secondary | ICD-10-CM | POA: Diagnosis not present

## 2021-01-15 DIAGNOSIS — H34812 Central retinal vein occlusion, left eye, with macular edema: Secondary | ICD-10-CM | POA: Diagnosis not present

## 2021-01-28 DIAGNOSIS — R51 Headache with orthostatic component, not elsewhere classified: Secondary | ICD-10-CM | POA: Diagnosis not present

## 2021-01-30 DIAGNOSIS — N181 Chronic kidney disease, stage 1: Secondary | ICD-10-CM | POA: Diagnosis not present

## 2021-01-30 DIAGNOSIS — Z125 Encounter for screening for malignant neoplasm of prostate: Secondary | ICD-10-CM | POA: Diagnosis not present

## 2021-01-30 DIAGNOSIS — I10 Essential (primary) hypertension: Secondary | ICD-10-CM | POA: Diagnosis not present

## 2021-01-30 DIAGNOSIS — R51 Headache with orthostatic component, not elsewhere classified: Secondary | ICD-10-CM | POA: Diagnosis not present

## 2021-02-04 DIAGNOSIS — R519 Headache, unspecified: Secondary | ICD-10-CM | POA: Diagnosis not present

## 2021-02-04 DIAGNOSIS — R930 Abnormal findings on diagnostic imaging of skull and head, not elsewhere classified: Secondary | ICD-10-CM | POA: Diagnosis not present

## 2021-02-12 DIAGNOSIS — M542 Cervicalgia: Secondary | ICD-10-CM | POA: Diagnosis not present

## 2021-02-12 DIAGNOSIS — M25512 Pain in left shoulder: Secondary | ICD-10-CM | POA: Diagnosis not present

## 2021-02-18 DIAGNOSIS — M47812 Spondylosis without myelopathy or radiculopathy, cervical region: Secondary | ICD-10-CM | POA: Diagnosis not present

## 2021-02-18 DIAGNOSIS — I10 Essential (primary) hypertension: Secondary | ICD-10-CM | POA: Diagnosis not present

## 2021-02-18 DIAGNOSIS — M5412 Radiculopathy, cervical region: Secondary | ICD-10-CM | POA: Diagnosis not present

## 2021-02-19 DIAGNOSIS — K50913 Crohn's disease, unspecified, with fistula: Secondary | ICD-10-CM | POA: Diagnosis not present

## 2021-02-19 DIAGNOSIS — E538 Deficiency of other specified B group vitamins: Secondary | ICD-10-CM | POA: Diagnosis not present

## 2021-02-19 DIAGNOSIS — K50013 Crohn's disease of small intestine with fistula: Secondary | ICD-10-CM | POA: Diagnosis not present

## 2021-02-19 DIAGNOSIS — Z79899 Other long term (current) drug therapy: Secondary | ICD-10-CM | POA: Diagnosis not present

## 2021-02-25 DIAGNOSIS — M47812 Spondylosis without myelopathy or radiculopathy, cervical region: Secondary | ICD-10-CM | POA: Diagnosis not present

## 2021-02-25 DIAGNOSIS — M5030 Other cervical disc degeneration, unspecified cervical region: Secondary | ICD-10-CM | POA: Diagnosis not present

## 2021-02-25 DIAGNOSIS — M5412 Radiculopathy, cervical region: Secondary | ICD-10-CM | POA: Diagnosis not present

## 2021-04-01 DIAGNOSIS — M5412 Radiculopathy, cervical region: Secondary | ICD-10-CM | POA: Diagnosis not present

## 2021-04-01 DIAGNOSIS — M47812 Spondylosis without myelopathy or radiculopathy, cervical region: Secondary | ICD-10-CM | POA: Diagnosis not present

## 2021-05-25 DIAGNOSIS — Z1331 Encounter for screening for depression: Secondary | ICD-10-CM | POA: Diagnosis not present

## 2021-05-25 DIAGNOSIS — M25512 Pain in left shoulder: Secondary | ICD-10-CM | POA: Diagnosis not present

## 2021-05-25 DIAGNOSIS — I10 Essential (primary) hypertension: Secondary | ICD-10-CM | POA: Diagnosis not present

## 2021-05-25 DIAGNOSIS — N184 Chronic kidney disease, stage 4 (severe): Secondary | ICD-10-CM | POA: Diagnosis not present

## 2021-05-25 DIAGNOSIS — Z0000 Encounter for general adult medical examination without abnormal findings: Secondary | ICD-10-CM | POA: Diagnosis not present

## 2021-05-25 DIAGNOSIS — M542 Cervicalgia: Secondary | ICD-10-CM | POA: Diagnosis not present

## 2021-06-08 DIAGNOSIS — H34812 Central retinal vein occlusion, left eye, with macular edema: Secondary | ICD-10-CM | POA: Diagnosis not present

## 2021-09-08 DIAGNOSIS — H34812 Central retinal vein occlusion, left eye, with macular edema: Secondary | ICD-10-CM | POA: Diagnosis not present

## 2021-09-08 DIAGNOSIS — H35033 Hypertensive retinopathy, bilateral: Secondary | ICD-10-CM | POA: Diagnosis not present

## 2021-09-09 DIAGNOSIS — Z0000 Encounter for general adult medical examination without abnormal findings: Secondary | ICD-10-CM | POA: Diagnosis not present

## 2021-09-09 DIAGNOSIS — M25512 Pain in left shoulder: Secondary | ICD-10-CM | POA: Diagnosis not present

## 2021-09-09 DIAGNOSIS — N184 Chronic kidney disease, stage 4 (severe): Secondary | ICD-10-CM | POA: Diagnosis not present

## 2021-09-09 DIAGNOSIS — M542 Cervicalgia: Secondary | ICD-10-CM | POA: Diagnosis not present

## 2021-09-09 DIAGNOSIS — Z1331 Encounter for screening for depression: Secondary | ICD-10-CM | POA: Diagnosis not present

## 2021-09-09 DIAGNOSIS — I10 Essential (primary) hypertension: Secondary | ICD-10-CM | POA: Diagnosis not present

## 2021-09-09 DIAGNOSIS — M545 Low back pain, unspecified: Secondary | ICD-10-CM | POA: Diagnosis not present

## 2021-09-29 DIAGNOSIS — Z79899 Other long term (current) drug therapy: Secondary | ICD-10-CM | POA: Diagnosis not present

## 2021-09-29 DIAGNOSIS — K5090 Crohn's disease, unspecified, without complications: Secondary | ICD-10-CM | POA: Diagnosis not present

## 2021-09-29 DIAGNOSIS — E538 Deficiency of other specified B group vitamins: Secondary | ICD-10-CM | POA: Diagnosis not present

## 2021-09-29 DIAGNOSIS — K50013 Crohn's disease of small intestine with fistula: Secondary | ICD-10-CM | POA: Diagnosis not present

## 2021-10-08 DIAGNOSIS — Z79899 Other long term (current) drug therapy: Secondary | ICD-10-CM | POA: Diagnosis not present

## 2021-10-08 DIAGNOSIS — K50013 Crohn's disease of small intestine with fistula: Secondary | ICD-10-CM | POA: Diagnosis not present

## 2021-10-08 DIAGNOSIS — Z9889 Other specified postprocedural states: Secondary | ICD-10-CM | POA: Diagnosis not present

## 2021-11-12 DIAGNOSIS — H401123 Primary open-angle glaucoma, left eye, severe stage: Secondary | ICD-10-CM | POA: Diagnosis not present

## 2021-12-09 DIAGNOSIS — N184 Chronic kidney disease, stage 4 (severe): Secondary | ICD-10-CM | POA: Diagnosis not present

## 2021-12-09 DIAGNOSIS — I10 Essential (primary) hypertension: Secondary | ICD-10-CM | POA: Diagnosis not present

## 2021-12-09 DIAGNOSIS — M542 Cervicalgia: Secondary | ICD-10-CM | POA: Diagnosis not present

## 2021-12-17 DIAGNOSIS — H34812 Central retinal vein occlusion, left eye, with macular edema: Secondary | ICD-10-CM | POA: Diagnosis not present

## 2022-02-18 DIAGNOSIS — E538 Deficiency of other specified B group vitamins: Secondary | ICD-10-CM | POA: Diagnosis not present

## 2022-02-18 DIAGNOSIS — K5000 Crohn's disease of small intestine without complications: Secondary | ICD-10-CM | POA: Diagnosis not present

## 2022-02-18 DIAGNOSIS — R1310 Dysphagia, unspecified: Secondary | ICD-10-CM | POA: Diagnosis not present

## 2022-02-18 DIAGNOSIS — D84821 Immunodeficiency due to drugs: Secondary | ICD-10-CM | POA: Diagnosis not present

## 2022-02-18 DIAGNOSIS — K5090 Crohn's disease, unspecified, without complications: Secondary | ICD-10-CM | POA: Diagnosis not present

## 2022-02-18 DIAGNOSIS — K50013 Crohn's disease of small intestine with fistula: Secondary | ICD-10-CM | POA: Diagnosis not present

## 2022-03-17 DIAGNOSIS — Z125 Encounter for screening for malignant neoplasm of prostate: Secondary | ICD-10-CM | POA: Diagnosis not present

## 2022-03-17 DIAGNOSIS — I10 Essential (primary) hypertension: Secondary | ICD-10-CM | POA: Diagnosis not present

## 2022-03-17 DIAGNOSIS — K50813 Crohn's disease of both small and large intestine with fistula: Secondary | ICD-10-CM | POA: Diagnosis not present

## 2022-03-17 DIAGNOSIS — M542 Cervicalgia: Secondary | ICD-10-CM | POA: Diagnosis not present

## 2022-03-17 DIAGNOSIS — K50814 Crohn's disease of both small and large intestine with abscess: Secondary | ICD-10-CM | POA: Diagnosis not present

## 2022-03-17 DIAGNOSIS — N184 Chronic kidney disease, stage 4 (severe): Secondary | ICD-10-CM | POA: Diagnosis not present

## 2022-03-29 DIAGNOSIS — K50013 Crohn's disease of small intestine with fistula: Secondary | ICD-10-CM | POA: Diagnosis not present

## 2022-04-16 DIAGNOSIS — K603 Anal fistula: Secondary | ICD-10-CM | POA: Diagnosis not present

## 2022-04-16 DIAGNOSIS — K50113 Crohn's disease of large intestine with fistula: Secondary | ICD-10-CM | POA: Diagnosis not present

## 2022-04-23 DIAGNOSIS — K603 Anal fistula: Secondary | ICD-10-CM | POA: Diagnosis not present

## 2022-04-23 DIAGNOSIS — K50013 Crohn's disease of small intestine with fistula: Secondary | ICD-10-CM | POA: Diagnosis not present

## 2022-04-29 DIAGNOSIS — Z48815 Encounter for surgical aftercare following surgery on the digestive system: Secondary | ICD-10-CM | POA: Diagnosis not present

## 2022-04-29 DIAGNOSIS — H35033 Hypertensive retinopathy, bilateral: Secondary | ICD-10-CM | POA: Diagnosis not present

## 2022-04-29 DIAGNOSIS — Z9889 Other specified postprocedural states: Secondary | ICD-10-CM | POA: Diagnosis not present

## 2022-04-29 DIAGNOSIS — H34812 Central retinal vein occlusion, left eye, with macular edema: Secondary | ICD-10-CM | POA: Diagnosis not present

## 2022-05-13 DIAGNOSIS — K50013 Crohn's disease of small intestine with fistula: Secondary | ICD-10-CM | POA: Diagnosis not present

## 2022-06-17 DIAGNOSIS — K5090 Crohn's disease, unspecified, without complications: Secondary | ICD-10-CM | POA: Diagnosis not present

## 2022-06-17 DIAGNOSIS — Z9889 Other specified postprocedural states: Secondary | ICD-10-CM | POA: Diagnosis not present

## 2022-06-22 DIAGNOSIS — N184 Chronic kidney disease, stage 4 (severe): Secondary | ICD-10-CM | POA: Diagnosis not present

## 2022-06-22 DIAGNOSIS — K50814 Crohn's disease of both small and large intestine with abscess: Secondary | ICD-10-CM | POA: Diagnosis not present

## 2022-06-22 DIAGNOSIS — M542 Cervicalgia: Secondary | ICD-10-CM | POA: Diagnosis not present

## 2022-06-22 DIAGNOSIS — K50813 Crohn's disease of both small and large intestine with fistula: Secondary | ICD-10-CM | POA: Diagnosis not present

## 2022-06-22 DIAGNOSIS — I10 Essential (primary) hypertension: Secondary | ICD-10-CM | POA: Diagnosis not present

## 2022-07-17 DIAGNOSIS — R29818 Other symptoms and signs involving the nervous system: Secondary | ICD-10-CM | POA: Diagnosis not present

## 2022-07-17 DIAGNOSIS — R42 Dizziness and giddiness: Secondary | ICD-10-CM | POA: Diagnosis not present

## 2022-07-17 DIAGNOSIS — R531 Weakness: Secondary | ICD-10-CM | POA: Diagnosis not present
